# Patient Record
Sex: Female | Born: 1980 | Race: White | Hispanic: No | Marital: Single | Smoking: Current every day smoker
Health system: Southern US, Community
[De-identification: ages and names within clinical notes are randomized; demographics above are authoritative.]

## PROBLEM LIST (undated history)

## (undated) DIAGNOSIS — R569 Unspecified convulsions: Principal | ICD-10-CM

## (undated) DIAGNOSIS — F1910 Other psychoactive substance abuse, uncomplicated: Secondary | ICD-10-CM

## (undated) DIAGNOSIS — Z720 Tobacco use: Secondary | ICD-10-CM

## (undated) DIAGNOSIS — F32A Depression, unspecified: Secondary | ICD-10-CM

## (undated) DIAGNOSIS — Z789 Other specified health status: Secondary | ICD-10-CM

## (undated) DIAGNOSIS — F1090 Alcohol use, unspecified, uncomplicated: Secondary | ICD-10-CM

## (undated) DIAGNOSIS — C539 Malignant neoplasm of cervix uteri, unspecified: Secondary | ICD-10-CM

## (undated) HISTORY — PX: OTHER SURGICAL HISTORY: SHX169

---

## 2005-02-27 ENCOUNTER — Inpatient Hospital Stay: Payer: Self-pay | Admitting: Obstetrics and Gynecology

## 2006-06-02 ENCOUNTER — Encounter: Payer: Self-pay | Admitting: Obstetrics and Gynecology

## 2006-07-17 ENCOUNTER — Emergency Department: Payer: Self-pay | Admitting: Emergency Medicine

## 2006-07-27 ENCOUNTER — Emergency Department: Payer: Self-pay | Admitting: Emergency Medicine

## 2006-08-07 ENCOUNTER — Encounter: Payer: Self-pay | Admitting: Maternal & Fetal Medicine

## 2006-08-18 ENCOUNTER — Inpatient Hospital Stay: Payer: Self-pay | Admitting: Obstetrics & Gynecology

## 2010-03-19 ENCOUNTER — Encounter: Payer: Self-pay | Admitting: Maternal & Fetal Medicine

## 2010-05-08 ENCOUNTER — Encounter: Payer: Self-pay | Admitting: Pediatric Cardiology

## 2010-08-28 ENCOUNTER — Inpatient Hospital Stay: Payer: Self-pay

## 2011-06-04 ENCOUNTER — Ambulatory Visit: Payer: Self-pay | Admitting: Family Medicine

## 2011-08-19 ENCOUNTER — Encounter: Payer: Self-pay | Admitting: Maternal and Fetal Medicine

## 2011-12-23 ENCOUNTER — Inpatient Hospital Stay: Payer: Self-pay | Admitting: Obstetrics and Gynecology

## 2011-12-23 LAB — CBC WITH DIFFERENTIAL/PLATELET
Basophil #: 0.1 10*3/uL (ref 0.0–0.1)
Eosinophil #: 0.1 10*3/uL (ref 0.0–0.7)
HCT: 33 % — ABNORMAL LOW (ref 35.0–47.0)
Lymphocyte #: 1.5 10*3/uL (ref 1.0–3.6)
Lymphocyte %: 11.8 %
MCH: 29.2 pg (ref 26.0–34.0)
MCV: 87 fL (ref 80–100)
Monocyte %: 6.1 %
Platelet: 205 10*3/uL (ref 150–440)
RDW: 13.8 % (ref 11.5–14.5)

## 2011-12-24 LAB — HEMATOCRIT: HCT: 25.4 % — ABNORMAL LOW (ref 35.0–47.0)

## 2011-12-24 LAB — PATHOLOGY REPORT

## 2012-11-24 ENCOUNTER — Emergency Department: Payer: Self-pay | Admitting: Emergency Medicine

## 2012-11-24 LAB — CBC
HCT: 31.2 % — ABNORMAL LOW (ref 35.0–47.0)
HGB: 10.2 g/dL — ABNORMAL LOW (ref 12.0–16.0)
MCH: 25.5 pg — ABNORMAL LOW (ref 26.0–34.0)
MCV: 78 fL — ABNORMAL LOW (ref 80–100)
WBC: 21.9 10*3/uL — ABNORMAL HIGH (ref 3.6–11.0)

## 2012-11-24 LAB — COMPREHENSIVE METABOLIC PANEL
Anion Gap: 5 — ABNORMAL LOW (ref 7–16)
Bilirubin,Total: 0.6 mg/dL (ref 0.2–1.0)
Calcium, Total: 9.4 mg/dL (ref 8.5–10.1)
Creatinine: 0.98 mg/dL (ref 0.60–1.30)
EGFR (African American): >60
EGFR (Non-African Amer.): >60
Osmolality: 264 (ref 275–301)
Potassium: 4 mmol/L (ref 3.5–5.1)
SGOT(AST): 37 U/L (ref 15–37)
SGPT (ALT): 28 U/L (ref 12–78)
Sodium: 133 mmol/L — ABNORMAL LOW (ref 136–145)
Total Protein: 8.9 g/dL — ABNORMAL HIGH (ref 6.4–8.2)

## 2012-11-24 LAB — URINALYSIS, COMPLETE
Bacteria: NONE SEEN
Bilirubin,UR: NEGATIVE
Ketone: NEGATIVE
Leukocyte Esterase: NEGATIVE
Protein: 100
RBC,UR: <1 /HPF (ref 0–5)
Specific Gravity: 1.023 (ref 1.003–1.030)
WBC UR: 1 /HPF (ref 0–5)

## 2013-04-06 ENCOUNTER — Emergency Department: Payer: Self-pay | Admitting: Emergency Medicine

## 2014-02-27 ENCOUNTER — Emergency Department: Payer: Self-pay | Admitting: Physician Assistant

## 2014-02-27 LAB — COMPREHENSIVE METABOLIC PANEL
Albumin: 3.7 g/dL (ref 3.4–5.0)
Alkaline Phosphatase: 95 U/L (ref 46–116)
Anion Gap: 5 — ABNORMAL LOW (ref 7–16)
BUN: 12 mg/dL (ref 7–18)
Bilirubin,Total: 0.7 mg/dL (ref 0.2–1.0)
Calcium, Total: 9.6 mg/dL (ref 8.5–10.1)
Chloride: 102 mmol/L (ref 98–107)
Co2: 28 mmol/L (ref 21–32)
Creatinine: 0.89 mg/dL (ref 0.60–1.30)
Glucose: 112 mg/dL — ABNORMAL HIGH (ref 65–99)
Osmolality: 271 (ref 275–301)
Potassium: 4 mmol/L (ref 3.5–5.1)
SGOT(AST): 33 U/L (ref 15–37)
SGPT (ALT): 23 U/L (ref 14–63)
Sodium: 135 mmol/L — ABNORMAL LOW (ref 136–145)
Total Protein: 8.9 g/dL — ABNORMAL HIGH (ref 6.4–8.2)

## 2014-02-27 LAB — URINALYSIS, COMPLETE
Bilirubin,UR: NEGATIVE
GLUCOSE, UR: NEGATIVE mg/dL (ref 0–75)
Ketone: NEGATIVE
NITRITE: POSITIVE
Ph: 5 (ref 4.5–8.0)
Specific Gravity: 1.017 (ref 1.003–1.030)
Squamous Epithelial: 6

## 2014-02-27 LAB — CBC
HCT: 32.5 % — ABNORMAL LOW (ref 35.0–47.0)
HGB: 10.3 g/dL — ABNORMAL LOW (ref 12.0–16.0)
MCH: 23.3 pg — ABNORMAL LOW (ref 26.0–34.0)
MCHC: 31.5 g/dL — AB (ref 32.0–36.0)
MCV: 74 fL — ABNORMAL LOW (ref 80–100)
PLATELETS: 292 10*3/uL (ref 150–440)
RBC: 4.39 10*6/uL (ref 3.80–5.20)
RDW: 16.9 % — AB (ref 11.5–14.5)
WBC: 16.6 10*3/uL — ABNORMAL HIGH (ref 3.6–11.0)

## 2014-02-27 LAB — WET PREP, GENITAL

## 2014-05-17 NOTE — Op Note (Signed)
PATIENT NAME:  Felicia Nelson, Felicia Nelson MR#:  [id number] DATE OF BIRTH:  March 17, 1980  DATE OF PROCEDURE:  12/23/2011  PREOPERATIVE DIAGNOSES:  1. Term intrauterine pregnancy at 38 weeks, 4 days gestation.  2. Face presentation with mentum posterior.  3. Undesired fertility.   POSTOPERATIVE DIAGNOSES: 1. Term intrauterine pregnancy at 38 weeks, 4 days gestation.  2. Face presentation with mentum posterior.  3. Undesired fertility.   PROCEDURES PERFORMED:  1. Primary low transverse Cesarean section via Pfannenstiel skin incision. 2. Bilateral tubal ligation using Pomeroy method.  ANESTHESIA: Spinal.   PRIMARY SURGEON: Stoney Bang. Georgianne Fick, MD   ASSISTANT: Dalia Heading, CNM   ESTIMATED BLOOD LOSS: 400 mL.  OPERATIVE FLUIDS: 700 mL of Crystalloid.   URINE OUTPUT: 150 mL of clear urine.  PREOPERATIVE ANTIBIOTICS: 2 grams Ancef.  DRAINS OR TUBES:  1. Foley to gravity drainage. 2. On-Q catheter system.   IMPLANTS: None.   COMPLICATIONS: None.   SPECIMENS REMOVED: Portion of right and left tube as well as placenta.   INTRAOPERATIVE FINDINGS: Normal tubes, ovaries, and uterus. There was a left lateral hysterotomy extension which required placement of an O'Leary stitch. Delivery resulted in the birth of a liveborn female infant weighing 2680 grams, 5 pounds, 15 ounces; Apgars 9 and 9.   PATIENT CONDITION FOLLOWING PROCEDURE: Stable.   PROCEDURE IN DETAIL: Risks, benefits, and alternatives of the procedure as well as indication of a face presentation with mentum posterior were discussed with the patient prior to proceeding to the operating room. The patient was taken to the operating room where spinal anesthesia was administered. She was positioned in the supine position, prepped and draped in the usual sterile fashion. Time-out was performed and the level of anesthetic was noted to be adequate. A Pfannenstiel skin incision was made 2 cm above the pubic symphysis, carried down sharply to the  level of the rectus fascia using a knife. Fascia was incised in the midline using a knife and the fascial incision was extended using Mayo scissors. The rectus muscles were dissected off the superior border of the rectus fascia by elevating the rectus fascia with two Kocher clamps and then bluntly dissecting off the rectus muscles. The median raphe was incised using Mayo scissors. The inferior border of the rectus fascia was dissected off the rectus muscles in a similar fashion. The midline was identified. The peritoneum was entered bluntly. The peritoneal incision was extended using manual traction. A bladder blade was placed and a bladder flap was then created using Metzenbaum scissors and further developed digitally. The bladder blade was then replaced. Hysterotomy incision was made low transverse on the uterus. The uterus was entered bluntly using the operator's finger. Hysterotomy incision was extended using manual traction. Upon placing the operator's hand into the hysterotomy incision, the infant was noted to be in a face presentation with mentum posterior. The vertex was grasped, gently elevated, flexed, brought to the incision and delivered atraumatically using fundal pressure. The infant was suctioned, cord was clamped and cut, and the infant was passed to the awaiting pediatrician. Cord blood was obtained. The placenta was delivered using manual extraction. The uterus was exteriorized and wiped clean of clots and debris. Hysterotomy incision was closed using a single layer closure of 0 Vicryl in a running locked fashion. There was an extension noted on the left side of the hysterotomy incision that appeared to involve a branch of the uterine artery which in order to achieve hemostasis a single O'Leary stitch was placed on that side.  Following this good hemostasis was noted.   Attention was then turned to the right fallopian tube. It was grasped in the mid isthmic portion with a Babcock clamp, then  doubly suture ligated using a 0 chromic wheal. The intervening knuckle of tube was then excised using Metzenbaum scissors. This was repeated on the patient's left. The abdomen was then irrigated with warm saline.   The uterus was returned to the abdomen and the hysterotomy incision was reinspected and noted to be hemostatic as was the fallopian stumps. Peritoneum was then closed using 2-0 Vicryl in a running fashion. Following peritoneal closure, the On-Q catheter system was placed in the usual fashion. Following placement of the On-Q catheters, the fascia was closed using a #1 looped PDS in a running fashion. Subcutaneous tissue was irrigated. Hemostasis was achieved using the Bovie. The skin was closed using staples. Each On-Q catheter was then bolused with 5 mL of 1% bupivacaine. Following this, the On-Q catheters were dressed with Steri-Strips and a Tegaderm. Sponge, needle, and instrument counts were correct x2. The patient tolerated the procedure well and was taken to the recovery room in stable condition.   ____________________________ Stoney Bang. Georgianne Fick, MD ams:drc D: 12/24/2011 11:40:17 ET T: 12/24/2011 12:00:52 ET JOB#: [id number]  cc: Stoney Bang. Georgianne Fick, MD, <Dictator> Conan Bowens Madelon Lips MD ELECTRONICALLY SIGNED 01/02/2012 15:44

## 2014-06-07 NOTE — H&P (Signed)
L&D Evaluation:  History:   HPI 34 year old G10 P34 with EDC=01/02/2012 by a 9wk5day ultrasound presents to L&D at 38 4/7 weeks with c/o strong regular contractions between 5 and 6 Am this morning. She has had a moderate bloody show also. No LOF otherwise. Her prenatal care is via [facility] and was begun mid second trimester. Her last delivery was in 28 August 2010. 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 She is O negative blood type and received Rhogam 9/17. She has a history of depression and is currently taking Zoloft 100 mgm. She is also a smoker, now smoking 1 PPD. She had an ultrasound last week for growth but is not aware of results. OB HX significant for 9 vaginal deliveries (8 at term and one preterm at 36 weeks) with weights ranging from 5#7oz to 7#14 oz. LAbs: O negative, RI, VI, GBS positive. Patient desires a pp BTL and signed her 30 day papers 10/15/2011.    Presents with contractions, vaginal bleeding    Patient's Medical History Depresssion, severe cx  dysplasia, mild anemia    Patient's Surgical History Cleft lip repair, hand surgery (4 years ago)    Medications Pre Natal Vitamins  Zoloft 100 mgm daily    Allergies PCN, ? rxn as a child    Social History tobacco    Family History Non-Contributory   ROS:   ROS see HPI   Exam:   Vital Signs stable    Urine Protein not completed    General breathing with ctxs    Mental Status clear    Chest clear    Heart normal sinus rhythm, no murmur/gallop/rubs    Abdomen gravid, tender with contractions    Estimated Fetal Weight Small for gestational age    Fetal Position vtx    Edema no edema    Reflexes 2+    Pelvic no external lesions, 6-7/C/-2/BBOW    Mebranes Intact    FHT 130 baseline with accels to 150s, occasional  variable with quick rtn, mod variability.    Ucx q2-5 min    Skin dry    Lymph no lymphadenopathy     Impression:   Impression [place] at at 38 + weeks in active labor. +GBS   Plan:   Plan EFM/NST, monitor contractions and for cervical change, antibiotics for GBBS prophylaxis, Kefzol started for GBS coverage. IV line in place. T&S done.   Electronic Signatures: Karene Fry (CNM)  (Signed [phone] 08:41)  Authored: L&D Evaluation   Last Updated: 25-Nov-13 08:41 by Karene Fry (CNM)

## 2016-04-17 ENCOUNTER — Emergency Department
Admission: EM | Admit: 2016-04-17 | Discharge: 2016-04-18 | Disposition: A | Discharge status: Home / Self Care | Payer: Medicaid Other | Attending: Emergency Medicine | Admitting: Emergency Medicine

## 2016-04-17 ENCOUNTER — Encounter: Payer: Self-pay | Admitting: Emergency Medicine

## 2016-04-17 DIAGNOSIS — K047 Periapical abscess without sinus: Secondary | ICD-10-CM | POA: Diagnosis not present

## 2016-04-17 DIAGNOSIS — K0889 Other specified disorders of teeth and supporting structures: Secondary | ICD-10-CM

## 2016-04-17 DIAGNOSIS — F17200 Nicotine dependence, unspecified, uncomplicated: Secondary | ICD-10-CM | POA: Insufficient documentation

## 2016-04-17 MED ORDER — CLINDAMYCIN HCL 300 MG PO CAPS: 300.0000 mg | ORAL_CAPSULE | Freq: Three times a day (TID) | ORAL | 0 refills | Status: AC

## 2016-04-17 MED ORDER — KETOROLAC TROMETHAMINE 30 MG/ML IJ SOLN
30.0000 mg | Freq: Once | INTRAMUSCULAR | Status: AC
  Administered 2016-04-17: 30 mg via INTRAMUSCULAR
  Filled 2016-04-17: qty 1

## 2016-04-17 MED ORDER — KETOROLAC TROMETHAMINE 10 MG PO TABS: 10.0000 mg | ORAL_TABLET | Freq: Four times a day (QID) | ORAL | 0 refills | Status: AC | PRN

## 2016-04-17 MED ORDER — CLINDAMYCIN HCL 150 MG PO CAPS
300.0000 mg | ORAL_CAPSULE | Freq: Once | ORAL | Status: AC
  Administered 2016-04-17: 300 mg via ORAL
  Filled 2016-04-17: qty 2

## 2016-04-17 NOTE — Discharge Instructions (Signed)
OPTIONS FOR DENTAL FOLLOW UP CARE ° °[place] Department of Health and Human Services - Local Safety Net Dental Clinics °http://www.ncdhhs.gov/dph/oralhealth/services/safetynetclinics.htm °  °[facility] (336-562-3123) ° °[facility] [place] (919-933-9087) ° °[facility] ([phone] ext [phone]) ° °[facility] ([phone]) ° °[facility] ([phone]) °This clinic caters to the indigent population and is on a lottery system. °Location: °[facility], [address], [place] °Clinic Hours: °Wednesdays from 6pm - 9pm, patients seen by a lottery system. °For dates, call or go to [email or web address].[facility].edu/[facility]/patients/Dental-[facility] °Services: °Cleanings, fillings and simple extractions. °Payment Options: °DENTAL WORK IS FREE OF CHARGE. Bring proof of income or support. °Best way to get seen: °Arrive at 5:15 pm - this is a lottery, NOT first come/first serve, so arriving earlier will not increase your chances of being seen. °  °  °[facility] °[phone] °Select option 1 for emergencies °  °Location: °[facility], [address], [place] °Clinic Hours: °No walk-ins accepted - call the day before to schedule an appointment. °Check in times are 9:30 am and 1:30 pm. °Services: °Simple extractions, temporary fillings, pulpectomy/pulp debridement, uncomplicated abscess drainage. °Payment Options: °PAYMENT IS DUE AT THE TIME OF SERVICE.  Fee is usually $100-200, additional surgical procedures (e.g. abscess drainage) may be extra. °Cash, checks, Visa/MasterCard accepted.  Can file Medicaid if patient is covered for dental - patient should call case worker to check. °No discount for [facility] patients. °Best way to get seen: °MUST call the day before and get onto the schedule. Can usually be seen the next 1-2 days. No walk-ins accepted. °  °  °[facility] °[phone] °   °Location: °[facility], [address], [place] °Clinic Hours: °M, W, Th, F 8am or 1:30pm, Tues 9a or 1:30 - first come/first served. °Services: °Simple extractions, temporary fillings, uncomplicated abscess drainage.  You do not need to be an [place] resident. °Payment Options: °PAYMENT IS DUE AT THE TIME OF SERVICE. °Dental insurance, otherwise sliding scale - bring proof of income or support. °Depending on income and treatment needed, cost is usually $50-200. °Best way to get seen: °Arrive early as it is first come/first served. °  °  °[facility] °[phone] °  °Location: °[address] °Clinic Hours: °Mon-Thu 8a-5p °Services: °Most basic dental services including extractions and fillings. °Payment Options: °PAYMENT IS DUE AT THE TIME OF SERVICE. °Sliding scale, up to 50% off - bring proof if income or support. °Medicaid with dental option accepted. °Best way to get seen: °Call to schedule an appointment, can usually be seen within 2 weeks OR they will try to see walk-ins - show up at 8a or 2p (you may have to wait). °  °  °[facility] °[phone] °[place] RESIDENTS ONLY °  °Location: °[facility], [address], [place],  [address] °Clinic Hours: By appointment only. °Monday - Thursday 8am-5pm, Friday 8am-12pm °Services: Cleanings, fillings, extractions. °Payment Options: °PAYMENT IS DUE AT THE TIME OF SERVICE. °Cash, Visa or MasterCard. Sliding scale - $30 minimum per service. °Best way to get seen: °Come in to office, complete packet and make an appointment - need proof of income °or support monies for each household member and proof of [place] residence. °Usually takes about a month to get in. °  °  °[facility] °[phone] °  °Location: °[address]   [place] °Clinic Hours: Walk-in Urgent Care Dental Services are offered Monday-Friday  mornings only. °The numbers of emergencies accepted daily is limited to the number of °providers available. °Maximum 15 - Mondays, Wednesdays & Thursdays °Maximum 10 - Tuesdays & Fridays °Services: °You do not need to be a [place] resident to be seen for a dental emergency. °Emergencies are defined as pain, swelling, abnormal bleeding, or dental trauma. Walkins will receive x-rays if needed. °NOTE: Dental cleaning is not an emergency. °Payment Options: °PAYMENT IS DUE AT THE TIME OF SERVICE. °Minimum co-pay is $40.00 for uninsured patients. °Minimum co-pay is $3.00 for Medicaid with dental coverage. °Dental Insurance is accepted and must be presented at time of visit. °Medicare does not cover dental. °Forms of payment: Cash, credit card, checks. °Best way to get seen: °If not previously registered with the clinic, walk-in dental registration begins at 7:15 am and is on a first come/first serve basis. °If previously registered with the clinic, call to make an appointment. °  °  °[facility] °[phone] °[place] RESIDENTS ONLY °  °Location: °[address], [place], [place] °Clinic Hours: °Mon-Thu 10a-2p °Services: Extractions only! °Payment Options: °FREE (donations accepted) - bring proof of income or support °Best way to get seen: °Call and schedule an appointment OR come at 8am on the 1st Monday of every month (except for holidays) when it is first come/first served. °  °  °[facility] °[phone] °  °Location: °[address], [place] °Clinic Hours: °Friday mornings °Services, Payment Options, Best way to get seen: °Call for info °

## 2016-04-17 NOTE — ED Triage Notes (Signed)
Pt ambulatory to triage with steady gait, no distress noted. Pt c/o left bottom dental pain, discharge and swelling x2 days. Area of symptoms is black, no discharge noted, swelling noted to the left side of jaw. Pt sts she has dental appointment on May 02, 2016, unable to wait until appointment due to pain.

## 2016-04-18 NOTE — ED Provider Notes (Signed)
[facility] Emergency Department Provider Note  ____________________________________________  Time seen: Approximately 12:12 AM  I have reviewed the triage vital signs and the nursing notes.   HISTORY  Chief Complaint Dental Pain    HPI Felicia Nelson is a 36 y.o. female presenting to the emergency department with a left lower jaw dental abscess. Patient states that she has numerous dental caries along the left lower jaw. She is unable to isolate a single tooth as a source for the dental abscess. Patient denies fever and chills. Patient states that she has an appointment with a local dentist on 05/02/2016. However, she is concerned that infection will have to be managed prior to seeking care with dentist. Patient has been taking ibuprofen but no other alleviating measures. 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   History reviewed. No pertinent past medical history.  There are no active problems to display for this patient.   History reviewed. No pertinent surgical history.  Prior to Admission medications   Medication Sig Start Date End Date Taking? Authorizing Provider  clindamycin (CLEOCIN) 300 MG capsule Take 1 capsule (300 mg total) by mouth 3 (three) times daily. 04/17/16 04/27/16  Lannie Fields, PA-C  ketorolac (TORADOL) 10 MG tablet Take 1 tablet (10 mg total) by mouth every 6 (six) hours as needed. 04/17/16 04/22/16  Lannie Fields, PA-C    Allergies Patient has no known allergies.  History reviewed. No pertinent family history.  Social History Social History  Substance Use Topics  . Smoking status: Current Every Day Smoker  . Smokeless tobacco: Never Used  . Alcohol use No    Review of Systems  Constitutional: No fever/chills Eyes: No visual changes. No discharge ENT: She has dental abscess and dental pain. Cardiovascular: no chest pain. Respiratory: no cough. No SOB. Gastrointestinal: No abdominal pain.  No nausea,  no vomiting.  No diarrhea.  No constipation. Genitourinary: Negative for dysuria. No hematuria Musculoskeletal: Negative for musculoskeletal pain. Skin: Negative for rash, abrasions, lacerations, ecchymosis. Neurological: Negative for headaches, focal weakness or numbness. ____________________________________________   PHYSICAL EXAM:  VITAL SIGNS: ED Triage Vitals  Enc Vitals Group     BP 04/17/16 2057 (!) 142/82     Pulse Rate 04/17/16 2057 89     Resp 04/17/16 2057 16     Temp 04/17/16 2057 98.6 F (37 C)     Temp Source 04/17/16 2057 Oral     SpO2 04/17/16 2057 100 %     Weight 04/17/16 2058 120 lb (54.4 kg)     Height 04/17/16 2058 5\' 4"  (1.626 m)     Head Circumference --      Peak Flow --      Pain Score 04/17/16 2135 8     Pain Loc --      Pain Edu? --      Excl. in [place]? --    Constitutional: Alert and oriented. Well appearing and in no acute distress. Eyes: Conjunctivae are normal. PERRL. EOMI. Head: Atraumatic. ENT:      Ears: Tympanic membranes are pearly bilaterally.      Nose: No congestion/rhinnorhea.      Mouth/Throat:  Patient has a 1 cm x 1 cm region of focal edema of the skin overlying the left lower jaw. Patient has numerous dental caries along the left lower jaw. Neck: FROM.  Hematological/Lymphatic/Immunilogical: No cervical lymphadenopathy. Cardiovascular: Normal rate, regular rhythm. Normal S1 and S2.  Good peripheral circulation.  Respiratory: Normal respiratory effort without tachypnea or retractions. Lungs CTAB. Good air entry to the bases with no decreased or absent breath sounds. Musculoskeletal: Full range of motion to all extremities. No gross deformities appreciated. Neurologic:  Normal speech and language. No gross focal neurologic deficits are appreciated.  Skin:  Skin is warm, dry and intact. No rash noted. Psychiatric: Mood and affect are normal. Speech and behavior are normal. Patient exhibits appropriate insight and  judgement. ____________________________________________   LABS (all labs ordered are listed, but only abnormal results are displayed)  Labs Reviewed - No data to display ____________________________________________  EKG   ____________________________________________  RADIOLOGY   No results found.  ____________________________________________    PROCEDURES  Procedure(s) performed:    Procedures    Medications  ketorolac (TORADOL) 30 MG/ML injection 30 mg (30 mg Intramuscular Given 04/17/16 2333)  clindamycin (CLEOCIN) capsule 300 mg (300 mg Oral Given 04/17/16 2333)     ____________________________________________   INITIAL IMPRESSION / ASSESSMENT AND PLAN / ED COURSE  Pertinent labs & imaging results that were available during my care of the patient were reviewed by me and considered in my medical decision making (see chart for details).  Review of the [place] CSRS was performed in accordance of the [place] prior to dispensing any controlled drugs.     Assessment and Plan: Dental Abscess  Patient presents to the emergency department with a dental abscess of the skin overlying the left lower jaw. Patient has numerous dental caries of the left lower jaw. Patient was advised to seek care with a dentist immediately. An injection of Toradol was given in the emergency department and patient was discharged with oral Toradol. Patient was given clindamycin in the emergency department and discharged with clindamycin. Vital signs are reassuring at this time. All patient questions were answered.  ____________________________________________  FINAL CLINICAL IMPRESSION(S) / ED DIAGNOSES  Final diagnoses:  Pain, dental  Dental abscess      NEW MEDICATIONS STARTED DURING THIS VISIT:  Discharge Medication List as of 04/17/2016 11:26 PM    START taking these medications   Details  clindamycin (CLEOCIN) 300 MG capsule Take 1 capsule (300 mg total) by mouth 3 (three) times  daily., Starting Wed 04/17/2016, Until Sat 04/27/2016, Print    ketorolac (TORADOL) 10 MG tablet Take 1 tablet (10 mg total) by mouth every 6 (six) hours as needed., Starting Wed 04/17/2016, Until Mon 04/22/2016, Print            This chart was dictated using voice recognition software/Dragon. Despite best efforts to proofread, errors can occur which can change the meaning. Any change was purely unintentional.    Lannie Fields, PA-C 04/18/16 0019    Eula Listen, MD 04/20/16 1048

## 2016-07-15 ENCOUNTER — Emergency Department
Admission: EM | Admit: 2016-07-15 | Discharge: 2016-07-15 | Disposition: A | Discharge status: Home / Self Care | Payer: Medicaid Other | Attending: Emergency Medicine | Admitting: Emergency Medicine

## 2016-07-15 ENCOUNTER — Emergency Department: Payer: Medicaid Other

## 2016-07-15 DIAGNOSIS — W172XXA Fall into hole, initial encounter: Secondary | ICD-10-CM | POA: Diagnosis not present

## 2016-07-15 DIAGNOSIS — Y9301 Activity, walking, marching and hiking: Secondary | ICD-10-CM | POA: Insufficient documentation

## 2016-07-15 DIAGNOSIS — F17200 Nicotine dependence, unspecified, uncomplicated: Secondary | ICD-10-CM | POA: Diagnosis not present

## 2016-07-15 DIAGNOSIS — Y9269 Other specified industrial and construction area as the place of occurrence of the external cause: Secondary | ICD-10-CM | POA: Diagnosis not present

## 2016-07-15 DIAGNOSIS — S0101XA Laceration without foreign body of scalp, initial encounter: Secondary | ICD-10-CM | POA: Diagnosis not present

## 2016-07-15 DIAGNOSIS — S0990XA Unspecified injury of head, initial encounter: Secondary | ICD-10-CM

## 2016-07-15 DIAGNOSIS — W19XXXA Unspecified fall, initial encounter: Secondary | ICD-10-CM

## 2016-07-15 DIAGNOSIS — Y998 Other external cause status: Secondary | ICD-10-CM | POA: Insufficient documentation

## 2016-07-15 MED ORDER — BACITRACIN-NEOMYCIN-POLYMYXIN 400-5-5000 EX OINT
TOPICAL_OINTMENT | Freq: Once | CUTANEOUS | Status: AC
  Administered 2016-07-15: 1 via TOPICAL
  Filled 2016-07-15: qty 1

## 2016-07-15 MED ORDER — TRAMADOL HCL 50 MG PO TABS
50.0000 mg | ORAL_TABLET | Freq: Once | ORAL | Status: AC
  Administered 2016-07-15: 50 mg via ORAL
  Filled 2016-07-15: qty 1

## 2016-07-15 MED ORDER — LIDOCAINE-EPINEPHRINE-TETRACAINE (LET) SOLUTION
3.0000 mL | Freq: Once | NASAL | Status: AC
  Administered 2016-07-15: 3 mL via TOPICAL

## 2016-07-15 MED ORDER — TRAMADOL HCL 50 MG PO TABS: 50.0000 mg | ORAL_TABLET | Freq: Four times a day (QID) | ORAL | 0 refills | Status: DC | PRN

## 2016-07-15 MED ORDER — LIDOCAINE-EPINEPHRINE-TETRACAINE (LET) SOLUTION
NASAL | Status: AC
  Administered 2016-07-15: 3 mL via TOPICAL
  Filled 2016-07-15: qty 3
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
## 2016-07-15 NOTE — Discharge Instructions (Signed)
Please have your staples removed in 7-10 days

## 2016-07-15 NOTE — ED Notes (Signed)
Pt returned from CT °
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
## 2016-07-15 NOTE — ED Notes (Signed)

## 2016-07-15 NOTE — ED Notes (Signed)
Antibacterial ointment applied to laceration, then bandaged and wrapped using 2x2s and wrapped with gauze wrap. Pt educated about wound care and cleaning. Pt verbalized understanding.

## 2016-07-15 NOTE — ED Provider Notes (Signed)
[facility] Emergency Department Provider Note   ____________________________________________   First MD Initiated Contact with Patient 07/15/16 [phone]     (approximate)  I have reviewed the triage vital signs and the nursing notes.   HISTORY  Chief Complaint Fall    HPI Felicia Nelson is a 36 y.o. female who comes into the hospital today with a fall and a head injury. The patient reports that she was walking in an enclosure and there was a hole in the floor. She reports it is a pit where people typically fix cars. She reports that she was walking around a tractor and she must a fallen into the pit. She reports it is made of cement. She said that the last thing she remembers is falling and then having someone help her onto her porch. The patient reports that she was unconscious but she is unsure exactly how long she was. She states that she does not think it was a very long time. The patient has some head pain and neck pain which just started. She denies pain in her back and arms or legs. The patient rates her pain a 7 out of 10 in intensity. She came into the hospital by ambulance.    History reviewed. No pertinent past medical history.  There are no active problems to display for this patient.   History reviewed. No pertinent surgical history.  Prior to Admission medications   Medication Sig Start Date End Date Taking? Authorizing Provider  traMADol (ULTRAM) 50 MG tablet Take 1 tablet (50 mg total) by mouth every 6 (six) hours as needed. 07/15/16   Loney Hering, MD    Allergies Patient has no known allergies.  No family history on file.  Social History Social History  Substance Use Topics  . Smoking status: Current Every Day Smoker  . Smokeless tobacco: Never Used  . Alcohol use No    Review of Systems  Constitutional: No fever/chills Eyes: No visual changes. ENT: No sore throat. Cardiovascular: Denies chest pain. Respiratory:  Denies shortness of breath. Gastrointestinal: No abdominal pain.  No nausea, no vomiting.  No diarrhea.  No constipation. Genitourinary: Negative for dysuria. Musculoskeletal: Negative for back pain. Skin: Scalp laceration Neurological: Headache   ____________________________________________   PHYSICAL EXAM:  VITAL SIGNS: ED Triage Vitals  Enc Vitals Group     BP 07/15/16 0013 139/75     Pulse Rate 07/15/16 0013 72     Resp 07/15/16 0013 18     Temp 07/15/16 0013 98.6 F (37 C)     Temp Source 07/15/16 0013 Oral     SpO2 07/15/16 0013 100 %     Weight 07/15/16 0014 130 lb (59 kg)     Height 07/15/16 0014 5\' 4"  (1.626 m)     Head Circumference --      Peak Flow --      Pain Score 07/15/16 0019 6     Pain Loc --      Pain Edu? --      Excl. in [place]? --     Constitutional: Alert and oriented. Well appearing and in Mild distress. Eyes: Conjunctivae are normal. PERRL. EOMI. Head: Atraumatic. Nose: No congestion/rhinnorhea. Mouth/Throat: Mucous membranes are moist.  Oropharynx non-erythematous. Neck: Cervical spine tenderness to palpation Cardiovascular: Normal rate, regular rhythm. Grossly normal heart sounds.  Good peripheral circulation. Respiratory: Normal respiratory effort.  No retractions. Lungs CTAB. Gastrointestinal: Soft and nontender. No distention.Positive bowel sounds Musculoskeletal: No lower extremity  tenderness nor edema.  Neurologic:  Normal speech and language. Skin:  Skin is warm, dry and intact.  Psychiatric: Mood and affect are normal.   ____________________________________________   LABS (all labs ordered are listed, but only abnormal results are displayed)  Labs Reviewed - No data to display ____________________________________________  EKG  none ____________________________________________  RADIOLOGY  Ct Head Wo Contrast  Result Date: 07/15/2016 CLINICAL DATA:  Fall with occipital hematoma. Possible loss of consciousness. EXAM: CT HEAD  WITHOUT CONTRAST TECHNIQUE: Contiguous axial images were obtained from the base of the skull through the vertex without intravenous contrast. COMPARISON:  None. FINDINGS: Brain: No evidence of acute infarction, hemorrhage, hydrocephalus, extra-axial collection or mass lesion/mass effect. Mild generalized volume loss for age. Vascular: No hyperdense vessel or unexpected calcification. Skull: Normal. Negative for fracture or focal lesion. Sinuses/Orbits: Paranasal sinuses and mastoid air cells are clear. The visualized orbits are unremarkable. Other: Right occipital scalp laceration and hematoma. No radiopaque foreign body. IMPRESSION: Right occipital scalp laceration and hematoma without fracture or acute intracranial abnormality. Electronically Signed   By: Jeb Levering M.D.   On: 07/15/2016 01:45   Ct Cervical Spine Wo Contrast  Result Date: 07/15/2016 CLINICAL DATA:  Fall EXAM: CT CERVICAL SPINE WITHOUT CONTRAST TECHNIQUE: Multidetector CT imaging of the cervical spine was performed without intravenous contrast. Multiplanar CT image reconstructions were also generated. COMPARISON:  None. FINDINGS: Alignment: No static subluxation. Facets are aligned. Occipital condyles and the lateral masses of C1 and C2 are normally approximated. Skull base and vertebrae: No acute fracture. Soft tissues and spinal canal: No prevertebral fluid or swelling. No visible canal hematoma. Disc levels: No advanced spinal canal or neural foraminal stenosis. Upper chest: No pneumothorax, pulmonary nodule or pleural effusion. Other: Normal visualized paraspinal cervical soft tissues. IMPRESSION: No acute fracture or static subluxation of the cervical spine. Electronically Signed   By: Ulyses Jarred M.D.   On: 07/15/2016 03:28    ____________________________________________   PROCEDURES  Procedure(s) performed: please, see procedure note(s).  Marland Kitchen.Laceration Repair Date/Time: 07/15/2016 4:05 AM Performed by: Loney Hering Authorized by: Loney Hering   Consent:    Consent obtained:  Verbal   Consent given by:  Patient Anesthesia (see MAR for exact dosages):    Anesthesia method:  Topical application   Topical anesthetic:  LET Laceration details:    Location:  Scalp   Scalp location:  Occipital   Length (cm):  3 Repair type:    Repair type:  Simple Exploration:    Contaminated: no   Treatment:    Area cleansed with:  Soap and water   Amount of cleaning:  Standard Skin repair:    Repair method:  Staples   Number of staples:  3 Post-procedure details:    Dressing:  Antibiotic ointment   Patient tolerance of procedure:  Tolerated well, no immediate complications    Critical Care performed: No  ____________________________________________   INITIAL IMPRESSION / ASSESSMENT AND PLAN / ED COURSE  Pertinent labs & imaging results that were available during my care of the patient were reviewed by me and considered in my medical decision making (see chart for details).  This is a 36 year old who comes into the hospital today with a head injury and laceration. The patient had a CT scan of her head that showed an occipital scalp laceration with hematoma and no fracture. Jeannine Kitten the patient for a CT of her cervical spine as well and that I will repair her wound.  Clinical Course  as of Jul 15 428  Mon Jul 15, 2016  0207 Right occipital scalp laceration and hematoma without fracture or acute intracranial abnormality.   CT Head Wo Contrast [AW]  5830 No acute fracture or static subluxation of the cervical spine. CT Cervical Spine Wo Contrast [AW]    Clinical Course User Index [AW] Loney Hering, MD   The patient's CT scan is unremarkable. I will discharge the patient to home and she should have her staples removed in approximately 10 days.  ____________________________________________   FINAL CLINICAL IMPRESSION(S) / ED DIAGNOSES  Final diagnoses:  Fall, initial encounter  Injury  of head, initial encounter  Laceration of scalp, initial encounter      NEW MEDICATIONS STARTED DURING THIS VISIT:  New Prescriptions   TRAMADOL (ULTRAM) 50 MG TABLET    Take 1 tablet (50 mg total) by mouth every 6 (six) hours as needed.     Note:  This document was prepared using Dragon voice recognition software and may include unintentional dictation errors.    Loney Hering, MD 07/15/16 [phone]

## 2018-02-28 IMAGING — CT CT CERVICAL SPINE W/O CM
3 of 4 series · 12 of 33 positions shown, 14 images · non-contrast
Comparison: None.

CLINICAL DATA: Fall

EXAM:
CT CERVICAL SPINE WITHOUT CONTRAST
TECHNIQUE: Multidetector CT imaging of the cervical spine was performed without
intravenous contrast. Multiplanar CT image reconstructions were also
generated.

[Series 4: sagittal bone · sagittal · 0.22mm/px · 5 of 56 slices shown, 6 images]
[im 19/56  bone]
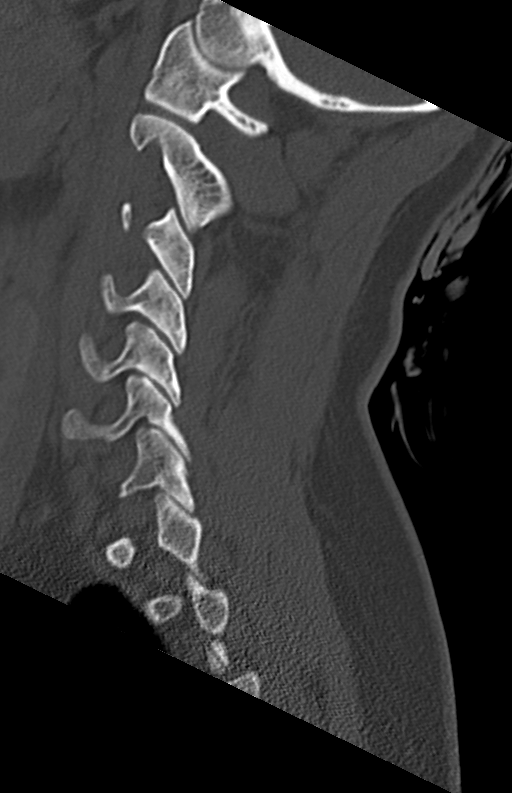
[im 23/56  bone]
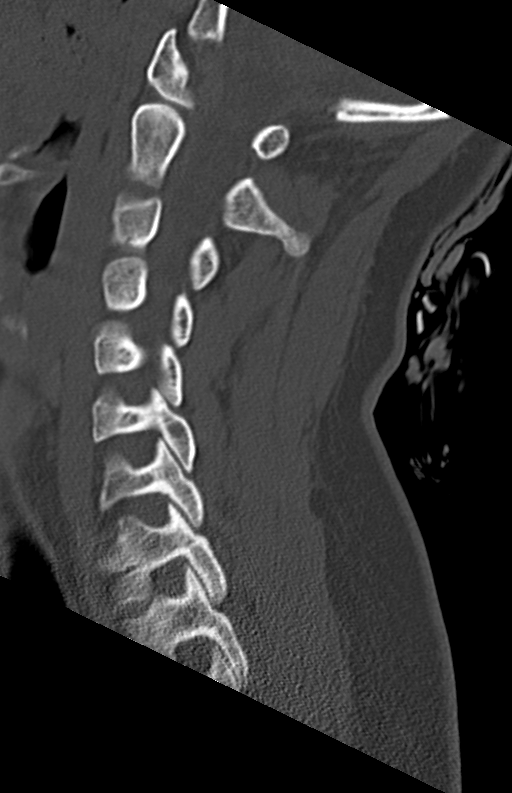
[im 28/56  soft-tissue]
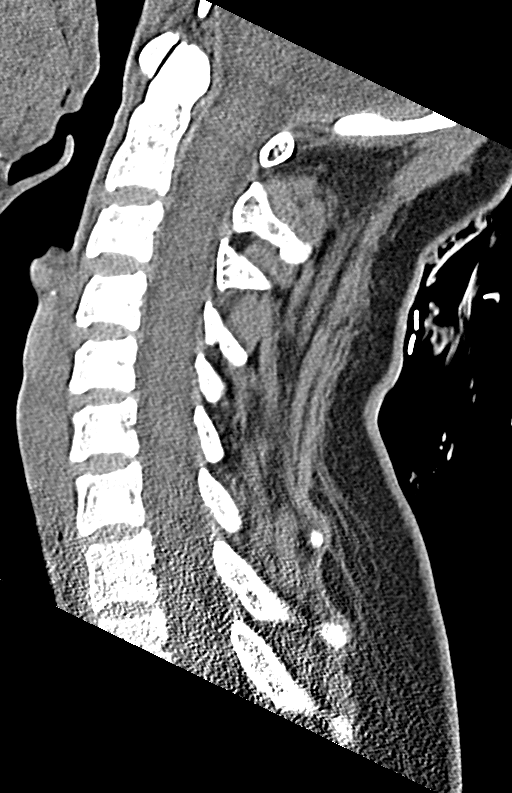
[im 28/56  bone]
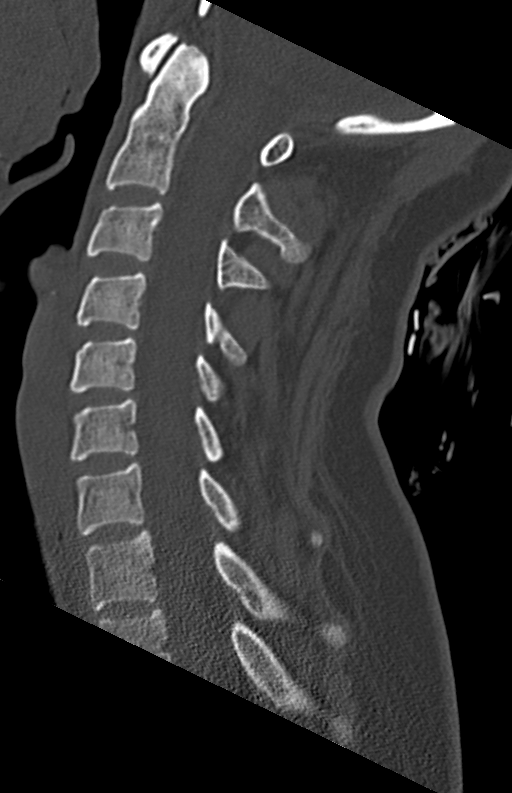
[im 33/56  bone]
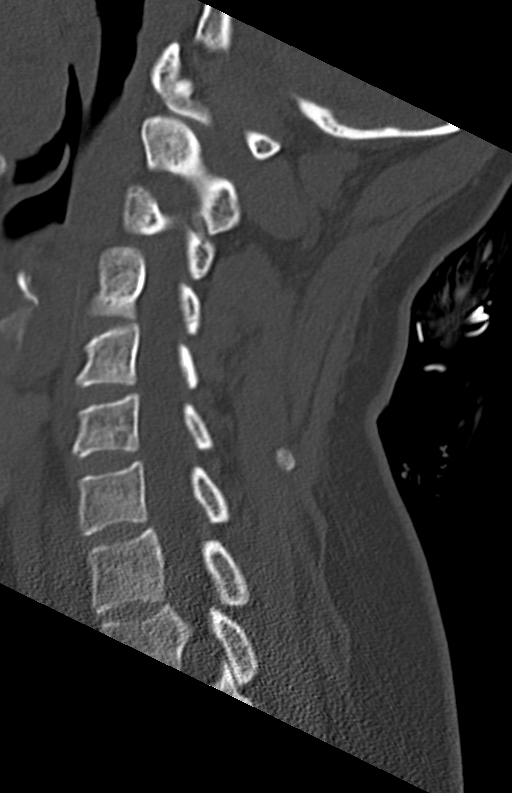
[im 37/56  bone]
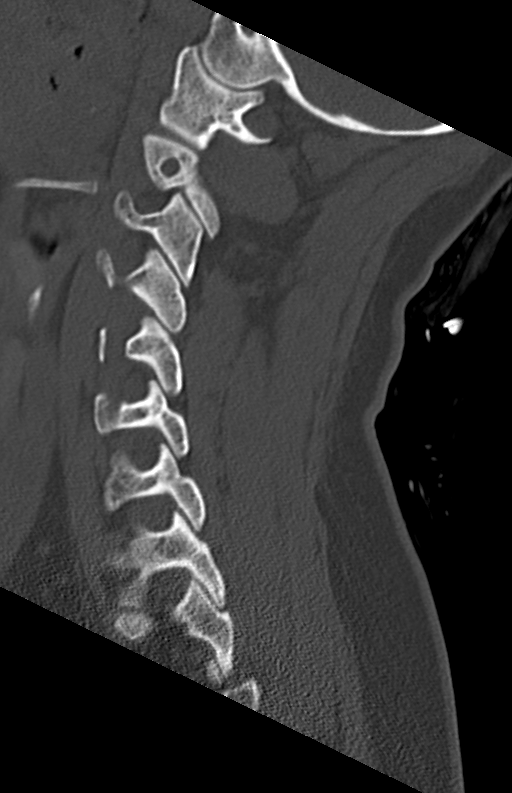

[Series 5: coronal bone · coronal · 0.22mm/px · 3 of 57 slices shown]
[im 12/57  bone]
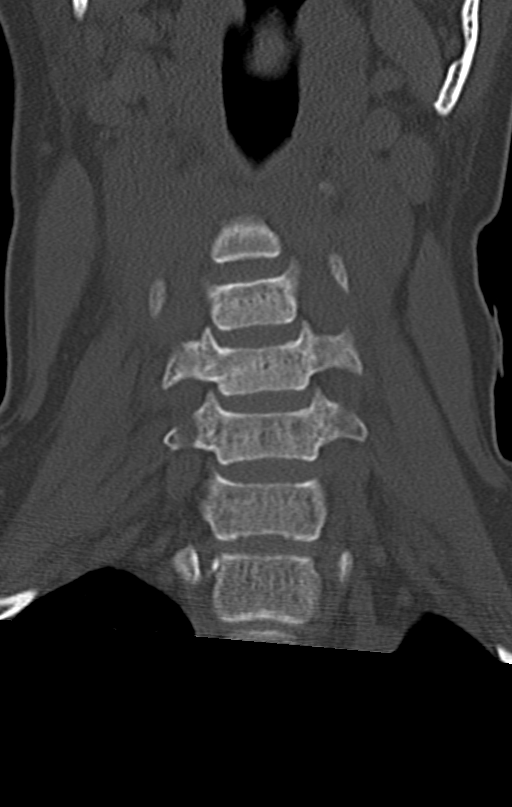
[im 23/57  bone]
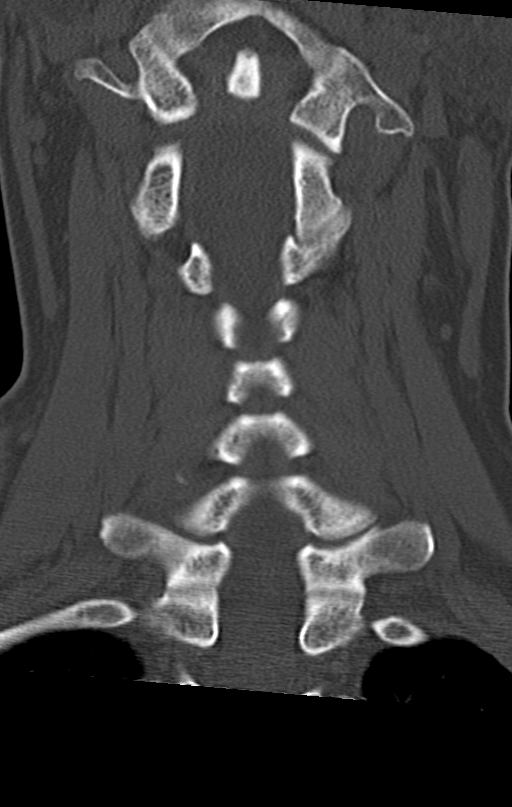
[im 34/57  bone]
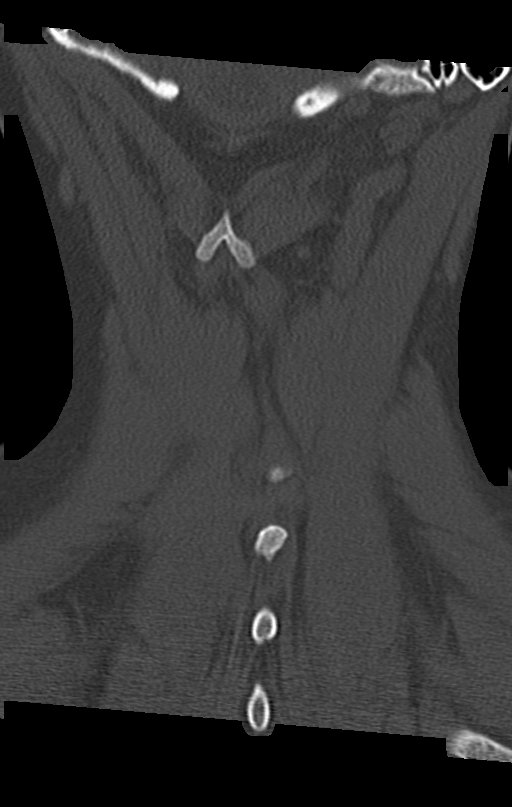

[Series 6: orthogonal bone · axial · 0.22mm/px · z∈[+126,+237]mm · 4 of 88 slices shown, 5 images]
[im 13/88  soft-tissue]
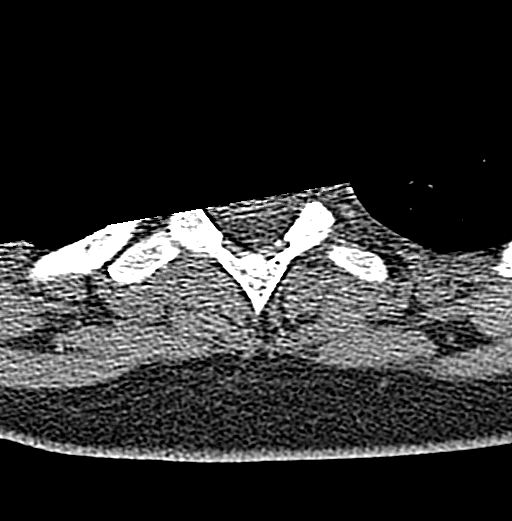
[im 13/88  bone]
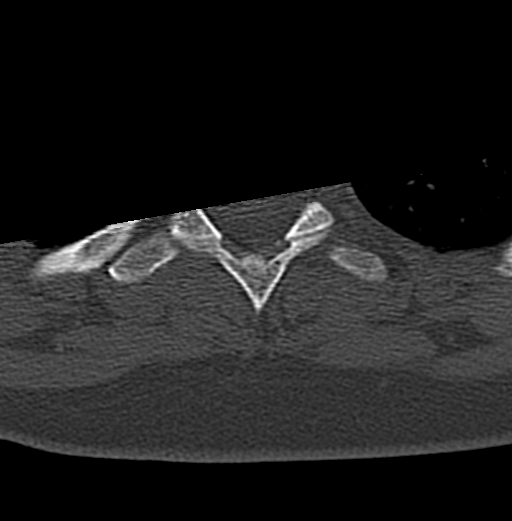
[im 38/88  bone]
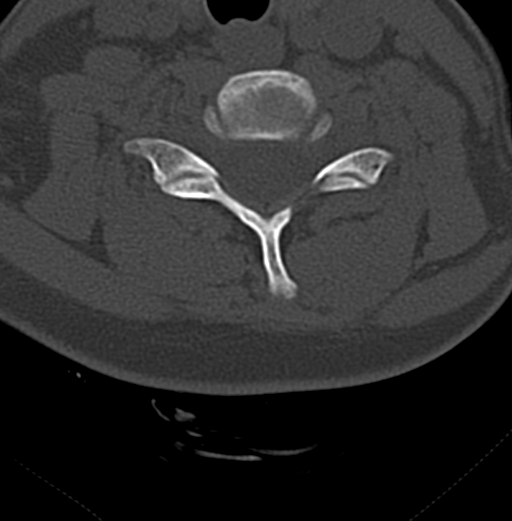
[im 50/88  bone]
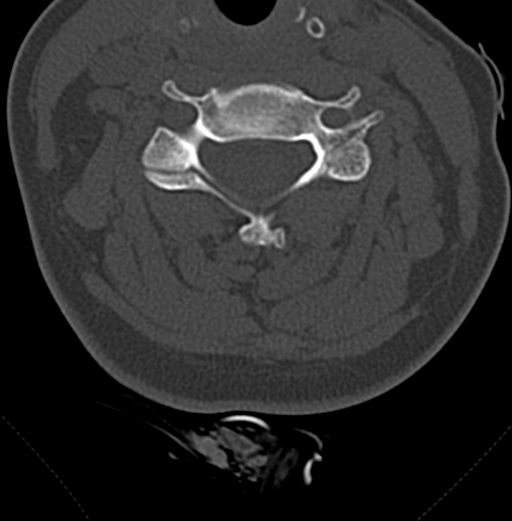
[im 75/88  bone]
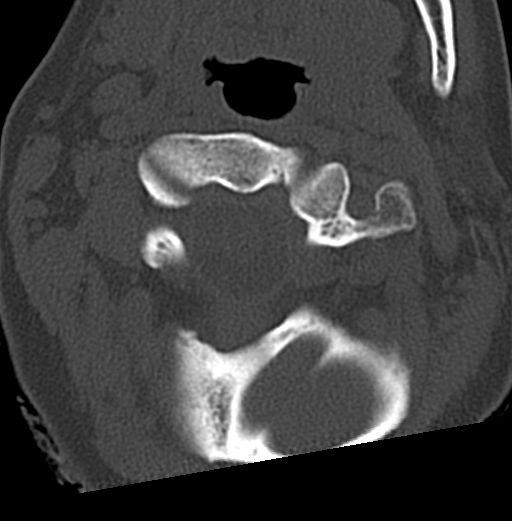

[12 of 33 positions shown; findings below may reference images not displayed]

FINDINGS: Alignment: No static subluxation. Facets are aligned. Occipital
condyles and the lateral masses of C1 and C2 are normally
approximated.

Skull base and vertebrae: No acute fracture.

Soft tissues and spinal canal: No prevertebral fluid or swelling. No
visible canal hematoma.

Disc levels: No advanced spinal canal or neural foraminal stenosis.

Upper chest: No pneumothorax, pulmonary nodule or pleural effusion.

Other: Normal visualized paraspinal cervical soft tissues.
IMPRESSION: No acute fracture or static subluxation of the cervical spine.

## 2020-11-20 ENCOUNTER — Ambulatory Visit: Payer: Medicaid Other | Admitting: Family Medicine

## 2020-11-20 ENCOUNTER — Encounter: Payer: Self-pay | Admitting: Family Medicine

## 2020-11-20 ENCOUNTER — Ambulatory Visit: Payer: Medicaid Other

## 2020-11-20 ENCOUNTER — Other Ambulatory Visit: Payer: Self-pay

## 2020-11-20 DIAGNOSIS — Z113 Encounter for screening for infections with a predominantly sexual mode of transmission: Secondary | ICD-10-CM | POA: Diagnosis not present

## 2020-11-20 DIAGNOSIS — Z8541 Personal history of malignant neoplasm of cervix uteri: Secondary | ICD-10-CM

## 2020-11-20 LAB — HM HIV SCREENING LAB: HM HIV Screening: NEGATIVE

## 2020-11-20 LAB — HM HEPATITIS C SCREENING LAB: HM Hepatitis Screen: NEGATIVE

## 2020-11-20 LAB — WET PREP FOR TRICH, YEAST, CLUE
Trichomonas Exam: NEGATIVE
Yeast Exam: NEGATIVE

## 2020-11-20 NOTE — Progress Notes (Signed)
Pt here for STD screening.  Wet mount results reviewed.  Condoms given. Windle Guard, RN

## 2020-11-20 NOTE — Progress Notes (Signed)
[facility] Department STI clinic/screening visit  Subjective:  ZELLA DEWAN is a 40 y.o. female being seen today for an STI screening visit. The patient reports they do not have symptoms.  Patient reports that they do not desire a pregnancy in the next year.   They reported they are not interested in discussing contraception today.  Patient's last menstrual period was 11/19/2020.   Patient has the following medical conditions:  There are no problems to display for this patient.   Chief Complaint  Patient presents with   SEXUALLY TRANSMITTED DISEASE    Screening     HPI  Patient reports here for screening before starting a new relationship, denies s/sx   Last HIV test per patient was with last pregnancy ~8 years ago  Patient reports last pap was ~8 years ago.  History of cervical cancer.    See flowsheet for further details and programmatic requirements.    The following portions of the patient's history were reviewed and updated as appropriate: allergies, current medications, past medical history, past social history, past surgical history and problem list.  Objective:  There were no vitals filed for this visit.  Physical Exam Vitals and nursing note reviewed.  Constitutional:      Appearance: Normal appearance.  HENT:     Head: Normocephalic and atraumatic.     Nose:      Mouth/Throat:     Mouth: Mucous membranes are moist.     Dentition: Abnormal dentition.     Pharynx: Oropharynx is clear. No oropharyngeal exudate or posterior oropharyngeal erythema.      Comments: Teeth missing Pulmonary:     Effort: Pulmonary effort is normal.  Abdominal:     General: Abdomen is flat.     Palpations: There is no mass.     Tenderness: There is no abdominal tenderness. There is no rebound.  Genitourinary:    General: Normal vulva.     Exam position: Lithotomy position.     Pubic Area: No rash or pubic lice.      Labia:        Right: No rash or lesion.         Left: No rash or lesion.      Vagina: Normal. No vaginal discharge, erythema, bleeding or lesions.     Cervix: No cervical motion tenderness, discharge, friability, lesion or erythema.     Uterus: Normal.      Adnexa: Right adnexa normal and left adnexa normal.     Rectum: Normal.  Lymphadenopathy:     Head:     Right side of head: No preauricular or posterior auricular adenopathy.     Left side of head: No preauricular or posterior auricular adenopathy.     Cervical: No cervical adenopathy.     Upper Body:     Right upper body: No supraclavicular or axillary adenopathy.     Left upper body: No supraclavicular or axillary adenopathy.     Lower Body: No right inguinal adenopathy. No left inguinal adenopathy.  Skin:    General: Skin is warm and dry.     Findings: No rash.  Neurological:     Mental Status: She is alert and oriented to person, place, and time.     Assessment and Plan:  AVEREIGH SPAINHOWER is a 40 y.o. female presenting to the [facility] Department for STI screening  1. Screening examination for venereal disease   Patient accepted all screenings including wet prep, vaginal CT/GC  and bloodwork for HCV/HIV/RPR.  Patient meets criteria for HepB screening? No. Ordered? No - does not meet criteria  Patient meets criteria for HepC screening? Yes. Ordered? Yes  Wet prep results neg    Treatment needed per standing order.  Discussed time line for [facility] Lab results and that patient will be called with positive results and encouraged patient to call if she had not heard in 2 weeks.  Counseled to return or seek care for continued or worsening symptoms Recommended condom use with all sex  Patient is currently using Postpartum Sterilization to prevent pregnancy.  - Chlamydia/Gonorrhea [place] [facility] - HIV/HCV [facility] - Syphilis Serology, [place] [facility] - WET PREP FOR [place], YEAST, CLUE   2. History of cervical cancer Pt reports that last pap was ` 8 years ago and  was positive for  cervical CA. Emphasized the importance of needing to see PCP or [facility] for re-screening.   -PCP list given.     Return for as needed.  No future appointments.  Junious Dresser, FNP

## 2020-11-21 NOTE — Progress Notes (Signed)
Reviewed with patient wet mount results and that she should RTC for test results visit for copy of all results.

## 2020-12-12 ENCOUNTER — Telehealth: Payer: Self-pay | Admitting: Family Medicine

## 2020-12-12 NOTE — Telephone Encounter (Signed)
Patient needs to talk with someone about her STI results. Please call.

## 2021-01-02 NOTE — Telephone Encounter (Signed)
Return call to patient to inquire if someone had reached out to her.  Message still in the queue.  Female answered the phone and reported she was not at home.  No message left. Dahlia Bailiff, RN

## 2022-02-24 ENCOUNTER — Inpatient Hospital Stay
Admission: EM | Admit: 2022-02-24 | Discharge: 2022-02-27 | DRG: 083 | Disposition: A | Discharge status: Home / Self Care | Payer: Medicaid Other | Attending: Internal Medicine | Admitting: Internal Medicine

## 2022-02-24 ENCOUNTER — Emergency Department: Payer: Medicaid Other

## 2022-02-24 ENCOUNTER — Other Ambulatory Visit: Payer: Self-pay

## 2022-02-24 ENCOUNTER — Inpatient Hospital Stay: Payer: Medicaid Other

## 2022-02-24 DIAGNOSIS — I629 Nontraumatic intracranial hemorrhage, unspecified: Secondary | ICD-10-CM

## 2022-02-24 DIAGNOSIS — Z6820 Body mass index (BMI) 20.0-20.9, adult: Secondary | ICD-10-CM | POA: Diagnosis not present

## 2022-02-24 DIAGNOSIS — F17210 Nicotine dependence, cigarettes, uncomplicated: Secondary | ICD-10-CM | POA: Diagnosis present

## 2022-02-24 DIAGNOSIS — S065X9A Traumatic subdural hemorrhage with loss of consciousness of unspecified duration, initial encounter: Secondary | ICD-10-CM | POA: Diagnosis present

## 2022-02-24 DIAGNOSIS — N390 Urinary tract infection, site not specified: Secondary | ICD-10-CM | POA: Diagnosis not present

## 2022-02-24 DIAGNOSIS — R7989 Other specified abnormal findings of blood chemistry: Secondary | ICD-10-CM | POA: Diagnosis present

## 2022-02-24 DIAGNOSIS — E872 Acidosis, unspecified: Secondary | ICD-10-CM | POA: Diagnosis present

## 2022-02-24 DIAGNOSIS — Z7151 Drug abuse counseling and surveillance of drug abuser: Secondary | ICD-10-CM

## 2022-02-24 DIAGNOSIS — F17290 Nicotine dependence, other tobacco product, uncomplicated: Secondary | ICD-10-CM | POA: Diagnosis present

## 2022-02-24 DIAGNOSIS — F10129 Alcohol abuse with intoxication, unspecified: Secondary | ICD-10-CM | POA: Diagnosis present

## 2022-02-24 DIAGNOSIS — F1910 Other psychoactive substance abuse, uncomplicated: Secondary | ICD-10-CM | POA: Diagnosis present

## 2022-02-24 DIAGNOSIS — S06359A Traumatic hemorrhage of left cerebrum with loss of consciousness of unspecified duration, initial encounter: Secondary | ICD-10-CM | POA: Diagnosis present

## 2022-02-24 DIAGNOSIS — Z7141 Alcohol abuse counseling and surveillance of alcoholic: Secondary | ICD-10-CM

## 2022-02-24 DIAGNOSIS — R569 Unspecified convulsions: Principal | ICD-10-CM

## 2022-02-24 DIAGNOSIS — Z79899 Other long term (current) drug therapy: Secondary | ICD-10-CM | POA: Diagnosis not present

## 2022-02-24 DIAGNOSIS — F1090 Alcohol use, unspecified, uncomplicated: Secondary | ICD-10-CM | POA: Diagnosis present

## 2022-02-24 DIAGNOSIS — Y92009 Unspecified place in unspecified non-institutional (private) residence as the place of occurrence of the external cause: Secondary | ICD-10-CM | POA: Diagnosis not present

## 2022-02-24 DIAGNOSIS — W19XXXA Unspecified fall, initial encounter: Secondary | ICD-10-CM

## 2022-02-24 DIAGNOSIS — F32A Depression, unspecified: Secondary | ICD-10-CM | POA: Diagnosis present

## 2022-02-24 DIAGNOSIS — S065XAA Traumatic subdural hemorrhage with loss of consciousness status unknown, initial encounter: Secondary | ICD-10-CM | POA: Diagnosis not present

## 2022-02-24 DIAGNOSIS — F4310 Post-traumatic stress disorder, unspecified: Secondary | ICD-10-CM | POA: Diagnosis present

## 2022-02-24 DIAGNOSIS — Z880 Allergy status to penicillin: Secondary | ICD-10-CM

## 2022-02-24 DIAGNOSIS — Z789 Other specified health status: Secondary | ICD-10-CM | POA: Diagnosis not present

## 2022-02-24 DIAGNOSIS — C539 Malignant neoplasm of cervix uteri, unspecified: Secondary | ICD-10-CM | POA: Insufficient documentation

## 2022-02-24 DIAGNOSIS — I609 Nontraumatic subarachnoid hemorrhage, unspecified: Secondary | ICD-10-CM | POA: Diagnosis not present

## 2022-02-24 DIAGNOSIS — G40909 Epilepsy, unspecified, not intractable, without status epilepticus: Secondary | ICD-10-CM | POA: Diagnosis present

## 2022-02-24 DIAGNOSIS — F1410 Cocaine abuse, uncomplicated: Secondary | ICD-10-CM | POA: Diagnosis present

## 2022-02-24 DIAGNOSIS — R64 Cachexia: Secondary | ICD-10-CM | POA: Diagnosis present

## 2022-02-24 DIAGNOSIS — Z833 Family history of diabetes mellitus: Secondary | ICD-10-CM | POA: Diagnosis not present

## 2022-02-24 DIAGNOSIS — Z720 Tobacco use: Secondary | ICD-10-CM | POA: Diagnosis not present

## 2022-02-24 DIAGNOSIS — D72829 Elevated white blood cell count, unspecified: Secondary | ICD-10-CM

## 2022-02-24 DIAGNOSIS — Z8541 Personal history of malignant neoplasm of cervix uteri: Secondary | ICD-10-CM | POA: Diagnosis not present

## 2022-02-24 HISTORY — DX: Other psychoactive substance abuse, uncomplicated: F19.10

## 2022-02-24 HISTORY — DX: Depression, unspecified: F32.A

## 2022-02-24 HISTORY — DX: Malignant neoplasm of cervix uteri, unspecified: C53.9

## 2022-02-24 HISTORY — DX: Unspecified convulsions: R56.9

## 2022-02-24 HISTORY — DX: Other specified abnormal findings of blood chemistry: R79.89

## 2022-02-24 HISTORY — DX: Tobacco use: Z72.0

## 2022-02-24 HISTORY — DX: Alcohol use, unspecified, uncomplicated: F10.90

## 2022-02-24 HISTORY — DX: Other specified health status: Z78.9

## 2022-02-24 HISTORY — DX: Elevated white blood cell count, unspecified: D72.829

## 2022-02-24 LAB — PROTIME-INR
INR: 1.1 (ref 0.8–1.2)
Prothrombin Time: 13.7 seconds (ref 11.4–15.2)

## 2022-02-24 LAB — COMPREHENSIVE METABOLIC PANEL
ALT: 12 U/L (ref 0–44)
AST: 42 U/L — ABNORMAL HIGH (ref 15–41)
Albumin: 3.7 g/dL (ref 3.5–5.0)
Alkaline Phosphatase: 88 U/L (ref 38–126)
Anion gap: 7 (ref 5–15)
BUN: 14 mg/dL (ref 6–20)
CO2: 22 mmol/L (ref 22–32)
Calcium: 8.4 mg/dL — ABNORMAL LOW (ref 8.9–10.3)
Chloride: 105 mmol/L (ref 98–111)
Creatinine, Ser: 0.94 mg/dL (ref 0.44–1.00)
GFR, Estimated: >60 mL/min (ref >60)
Glucose, Bld: 152 mg/dL — ABNORMAL HIGH (ref 70–99)
Potassium: 3.7 mmol/L (ref 3.5–5.1)
Sodium: 134 mmol/L — ABNORMAL LOW (ref 135–145)
Total Bilirubin: 1.1 mg/dL (ref 0.3–1.2)
Total Protein: 8.2 g/dL — ABNORMAL HIGH (ref 6.5–8.1)

## 2022-02-24 LAB — CBC WITH DIFFERENTIAL/PLATELET
Abs Immature Granulocytes: 0.07 10*3/uL (ref 0.00–0.07)
Basophils Absolute: 0 10*3/uL (ref 0.0–0.1)
Basophils Relative: 0 %
Eosinophils Absolute: 0 10*3/uL (ref 0.0–0.5)
Eosinophils Relative: 0 %
HCT: 34.5 % — ABNORMAL LOW (ref 36.0–46.0)
Hemoglobin: 11.1 g/dL — ABNORMAL LOW (ref 12.0–15.0)
Immature Granulocytes: 1 %
Lymphocytes Relative: 8 %
Lymphs Abs: 1.1 10*3/uL (ref 0.7–4.0)
MCH: 28.9 pg (ref 26.0–34.0)
MCHC: 32.2 g/dL (ref 30.0–36.0)
MCV: 89.8 fL (ref 80.0–100.0)
Monocytes Absolute: 1.1 10*3/uL — ABNORMAL HIGH (ref 0.1–1.0)
Monocytes Relative: 9 %
Neutro Abs: 10.5 10*3/uL — ABNORMAL HIGH (ref 1.7–7.7)
Neutrophils Relative %: 82 %
Platelets: 284 10*3/uL (ref 150–400)
RBC: 3.84 MIL/uL — ABNORMAL LOW (ref 3.87–5.11)
RDW: 14.1 % (ref 11.5–15.5)
WBC: 12.8 10*3/uL — ABNORMAL HIGH (ref 4.0–10.5)
nRBC: 0 % (ref 0.0–0.2)

## 2022-02-24 LAB — HCG, QUANTITATIVE, PREGNANCY: hCG, Beta Chain, Quant, S: <1 m[IU]/mL (ref <5)

## 2022-02-24 LAB — MRSA NEXT GEN BY PCR, NASAL: MRSA by PCR Next Gen: NOT DETECTED

## 2022-02-24 LAB — APTT: aPTT: 38 seconds — ABNORMAL HIGH (ref 24–36)

## 2022-02-24 LAB — BLOOD GAS, VENOUS
Acid-Base Excess: 2.4 mmol/L — ABNORMAL HIGH (ref 0.0–2.0)
Bicarbonate: 27.2 mmol/L (ref 20.0–28.0)
O2 Saturation: 65.7 %
Patient temperature: 37
pCO2, Ven: 42 mmHg — ABNORMAL LOW (ref 44–60)
pH, Ven: 7.42 (ref 7.25–7.43)
pO2, Ven: 39 mmHg (ref 32–45)

## 2022-02-24 LAB — ACETAMINOPHEN LEVEL: Acetaminophen (Tylenol), Serum: <10 ug/mL — ABNORMAL LOW (ref 10–30)

## 2022-02-24 LAB — SALICYLATE LEVEL: Salicylate Lvl: <7 mg/dL — ABNORMAL LOW (ref 7.0–30.0)

## 2022-02-24 LAB — MAGNESIUM: Magnesium: 2.3 mg/dL (ref 1.7–2.4)

## 2022-02-24 LAB — LACTIC ACID, PLASMA
Lactic Acid, Venous: 3.2 mmol/L (ref 0.5–1.9)
Lactic Acid, Venous: 0.9 mmol/L (ref 0.5–1.9)

## 2022-02-24 LAB — ETHANOL: Alcohol, Ethyl (B): <10 mg/dL (ref <10)

## 2022-02-24 LAB — GLUCOSE, CAPILLARY: Glucose-Capillary: 81 mg/dL (ref 70–99)

## 2022-02-24 MED ORDER — CHLORHEXIDINE GLUCONATE CLOTH 2 % EX PADS
6.0000 | MEDICATED_PAD | Freq: Every day | CUTANEOUS | Status: DC
  Administered 2022-02-25: 6 via TOPICAL

## 2022-02-24 MED ORDER — SODIUM CHLORIDE 0.9 % IV SOLN: INTRAVENOUS | Status: DC

## 2022-02-24 MED ORDER — ACETAMINOPHEN 325 MG PO TABS
650.0000 mg | ORAL_TABLET | Freq: Four times a day (QID) | ORAL | Status: DC | PRN
  Administered 2022-02-26 – 2022-02-27 (×3): 650 mg via ORAL
  Filled 2022-02-24 (×3): qty 2

## 2022-02-24 MED ORDER — LORAZEPAM 2 MG/ML IJ SOLN
0.0000 mg | Freq: Four times a day (QID) | INTRAMUSCULAR | Status: AC
  Administered 2022-02-24 – 2022-02-25 (×3): 2 mg via INTRAVENOUS
  Filled 2022-02-24 (×4): qty 1

## 2022-02-24 MED ORDER — THIAMINE MONONITRATE 100 MG PO TABS
100.0000 mg | ORAL_TABLET | Freq: Every day | ORAL | Status: DC
  Administered 2022-02-24 – 2022-02-27 (×4): 100 mg via ORAL
  Filled 2022-02-24 (×4): qty 1

## 2022-02-24 MED ORDER — LORAZEPAM 2 MG/ML IJ SOLN: 2.0000 mg | INTRAMUSCULAR | Status: DC | PRN

## 2022-02-24 MED ORDER — ORAL CARE MOUTH RINSE: 15.0000 mL | OROMUCOSAL | Status: DC | PRN

## 2022-02-24 MED ORDER — ONDANSETRON HCL 4 MG/2ML IJ SOLN: 4.0000 mg | Freq: Three times a day (TID) | INTRAMUSCULAR | Status: DC | PRN

## 2022-02-24 MED ORDER — NICOTINE 21 MG/24HR TD PT24
21.0000 mg | MEDICATED_PATCH | Freq: Every day | TRANSDERMAL | Status: DC
  Administered 2022-02-24 – 2022-02-27 (×4): 21 mg via TRANSDERMAL
  Filled 2022-02-24 (×5): qty 1

## 2022-02-24 MED ORDER — THIAMINE HCL 100 MG/ML IJ SOLN
100.0000 mg | Freq: Every day | INTRAMUSCULAR | Status: DC
  Filled 2022-02-24 (×2): qty 2

## 2022-02-24 MED ORDER — DIAZEPAM 5 MG/ML IJ SOLN
10.0000 mg | Freq: Once | INTRAMUSCULAR | Status: AC
  Administered 2022-02-24: 10 mg via INTRAVENOUS
  Filled 2022-02-24: qty 2

## 2022-02-24 MED ORDER — HYDRALAZINE HCL 20 MG/ML IJ SOLN: 5.0000 mg | INTRAMUSCULAR | Status: DC | PRN

## 2022-02-24 MED ORDER — FOLIC ACID 1 MG PO TABS
1.0000 mg | ORAL_TABLET | Freq: Every day | ORAL | Status: DC
  Administered 2022-02-24 – 2022-02-27 (×4): 1 mg via ORAL
  Filled 2022-02-24 (×4): qty 1

## 2022-02-24 MED ORDER — ADULT MULTIVITAMIN W/MINERALS CH
1.0000 | ORAL_TABLET | Freq: Every day | ORAL | Status: DC
  Administered 2022-02-24 – 2022-02-27 (×4): 1 via ORAL
  Filled 2022-02-24 (×4): qty 1

## 2022-02-24 MED ORDER — LEVETIRACETAM IN NACL 1000 MG/100ML IV SOLN
1000.0000 mg | Freq: Two times a day (BID) | INTRAVENOUS | Status: DC
  Administered 2022-02-24 – 2022-02-26 (×4): 1000 mg via INTRAVENOUS
  Filled 2022-02-24 (×5): qty 100

## 2022-02-24 MED ORDER — LORAZEPAM 2 MG/ML IJ SOLN: 0.0000 mg | Freq: Two times a day (BID) | INTRAMUSCULAR | Status: DC

## 2022-02-24 NOTE — Consult Note (Signed)
Neurosurgery-New Consultation Evaluation 02/24/2022 [name] [id number]  Identifying Statement: Felicia Nelson is a 42 y.o. female from [place] [address] with seizure and head strike with subdural hematoma  Physician Requesting Consultation: Dr. Jacelyn Grip, ED  History of Present Illness: The patient is a 42 year old right-handed female past medical history significant for seizure disorder since her 12s not currently on medication for seizures who presented this morning via EMS after generalized seizures x 2 this morning the setting of ?EtOH.  Per available data patient had a seizure at a family member's house EMS had been called she refused transfer at that time then later suffered an additional generalized seizure at home positive loss conscious positive bowel loss of bladder function.  Presented via EMS reportedly AO x 2 and undergoing CT scan imaging of the head patient demonstrating bilateral acute subdural hematomas as well as trace traumatic subarachnoid hemorrhage of the frontal lobe.  Neurosurgery was consulted  The patient herself notes she has some dull ache and headache.  Otherwise she notes she has some tenderness to her left cheek area as well as right frontal scalp area.  Past Medical History:  History reviewed. No pertinent past medical history.  Social History: Social History   Socioeconomic History   Marital status: Single    Spouse name: Not on file   Number of children: Not on file   Years of education: Not on file   Highest education level: Not on file  Occupational History   Not on file  Tobacco Use   Smoking status: Every Day    Packs/day: 0.75    Years: 25.00    Total pack years: 18.75    Types: Cigarettes, Cigars   Smokeless tobacco: Never  Vaping Use   Vaping Use: Never used  Substance and Sexual Activity   Alcohol use: Yes    Alcohol/week: 6.0 standard drinks of alcohol    Types: 6 Glasses of wine per week    Comment: mostly weekend   Drug use: Not  Currently    Comment: lat used >30 yrs ago   Sexual activity: Not Currently    Birth control/protection: Surgical    Comment: BTL 8 yrs ago  Other Topics Concern   Not on file  Social History Narrative   Not on file   Social Determinants of Health   Financial Resource Strain: Not on file  Food Insecurity: Not on file  Transportation Needs: Not on file  Physical Activity: Not on file  Stress: Not on file  Social Connections: Not on file  Intimate Partner Violence: Not At Risk (11/20/2020)   Humiliation, Afraid, Rape, and Kick questionnaire    Fear of Current or Ex-Partner: No    Emotionally Abused: No    Physically Abused: No    Sexually Abused: No   Living arrangements (living alone, with partner): unknown  Family History: History reviewed. No pertinent family history.  Review of Systems:  Review of Systems - General ROS: Negative Psychological ROS: Negative Ophthalmic ROS: Negative ENT ROS: Negative Hematological and Lymphatic ROS: Negative  Endocrine ROS: Negative Respiratory ROS: Negative Cardiovascular ROS: Negative Gastrointestinal ROS: Negative Genito-Urinary ROS: Negative Musculoskeletal ROS: Negative Neurological ROS: patient notes pain in the right frontal scalp, left maxillary area Dermatological ROS: Negative  Physical Exam: BP (!) 145/90   Pulse 84   Temp 98.3 F (36.8 C) (Oral)   Resp 17   Ht '5\' 4"'$  (1.626 m)   Wt 54.4 kg   SpO2 98%   BMI  20.60 kg/m  Body mass index is 20.6 kg/m. Body surface area is 1.57 meters squared. General appearance: somewhat lethargic appearing, slurred speaking and requiring ocassional redirection, not consistently attending Head: Normocephalic, atraumatic save bruising over the left maxillary face area as well as tenderness but not obvious laceration or bruising to the right frontal scalp area. Eyes: Normal, EOM intact Oropharynx: Moist without lesions Neck: Supple, no tenderness Heart: Normal, regular rate and  rhythm, without murmur Lungs: Clear to auscultation, good air exchange Abdomen: Soft, nondistended Ext: patchy bruising over bilateral LE  Neurologic exam:  Mental status: somewhat lethargic, difficult attending but answers questions on repeated asking, AOx person, place, time Speech: somewhat slurred Cranial nerves:  II: Visual fields are full by confrontation, no ptosis III/IV/VI: extra-ocular motions intact bilaterally V/VII:no evidence of facial droop or weakness and facial sensation intact VIII: hearing normal XI: trapezius strength symmetric,  sternocleidomastoid strength symmetric XII: tongue strength symmetric  Motor:strength symmetric 5/5, normal muscle mass and tone in all extremities and no pronator drift Sensory: intact to light touch in all extremities Reflexes: 2+ and symmetric bilaterally for arms and legs Coordination: intact finger to nose Gait: normal   Laboratory: Results for orders placed or performed during the hospital encounter of 02/24/22  Comprehensive metabolic panel  Result Value Ref Range   Sodium 134 (L) 135 - 145 mmol/L   Potassium 3.7 3.5 - 5.1 mmol/L   Chloride 105 98 - 111 mmol/L   CO2 22 22 - 32 mmol/L   Glucose, Bld 152 (H) 70 - 99 mg/dL   BUN 14 6 - 20 mg/dL   Creatinine, Ser 0.94 0.44 - 1.00 mg/dL   Calcium 8.4 (L) 8.9 - 10.3 mg/dL   Total Protein 8.2 (H) 6.5 - 8.1 g/dL   Albumin 3.7 3.5 - 5.0 g/dL   AST 42 (H) 15 - 41 U/L   ALT 12 0 - 44 U/L   Alkaline Phosphatase 88 38 - 126 U/L   Total Bilirubin 1.1 0.3 - 1.2 mg/dL   GFR, Estimated >60 >60 mL/min   Anion gap 7 5 - 15  Ethanol  Result Value Ref Range   Alcohol, Ethyl (B) <10 <10 mg/dL  Lactic acid, plasma  Result Value Ref Range   Lactic Acid, Venous 3.2 (HH) 0.5 - 1.9 mmol/L  CBC with Differential  Result Value Ref Range   WBC 12.8 (H) 4.0 - 10.5 K/uL   RBC 3.84 (L) 3.87 - 5.11 MIL/uL   Hemoglobin 11.1 (L) 12.0 - 15.0 g/dL   HCT 34.5 (L) 36.0 - 46.0 %   MCV 89.8 80.0 - 100.0  fL   MCH 28.9 26.0 - 34.0 pg   MCHC 32.2 30.0 - 36.0 g/dL   RDW 14.1 11.5 - 15.5 %   Platelets 284 150 - 400 K/uL   nRBC 0.0 0.0 - 0.2 %   Neutrophils Relative % 82 %   Neutro Abs 10.5 (H) 1.7 - 7.7 K/uL   Lymphocytes Relative 8 %   Lymphs Abs 1.1 0.7 - 4.0 K/uL   Monocytes Relative 9 %   Monocytes Absolute 1.1 (H) 0.1 - 1.0 K/uL   Eosinophils Relative 0 %   Eosinophils Absolute 0.0 0.0 - 0.5 K/uL   Basophils Relative 0 %   Basophils Absolute 0.0 0.0 - 0.1 K/uL   Immature Granulocytes 1 %   Abs Immature Granulocytes 0.07 0.00 - 0.07 K/uL  Magnesium  Result Value Ref Range   Magnesium 2.3 1.7 - 2.4 mg/dL  hCG, quantitative, pregnancy  Result Value Ref Range   hCG, Beta Chain, Quant, S <1 <5 mIU/mL  Acetaminophen level  Result Value Ref Range   Acetaminophen (Tylenol), Serum <10 (L) 10 - 30 ug/mL  Salicylate level  Result Value Ref Range   Salicylate Lvl <6.1 (L) 7.0 - 30.0 mg/dL  Blood gas, venous  Result Value Ref Range   pH, Ven 7.42 7.25 - 7.43   pCO2, Ven 42 (L) 44 - 60 mmHg   pO2, Ven 39 32 - 45 mmHg   Bicarbonate 27.2 20.0 - 28.0 mmol/L   Acid-Base Excess 2.4 (H) 0.0 - 2.0 mmol/L   O2 Saturation 65.7 %   Patient temperature 37.0    Collection site VEIN    I personally reviewed labs  Imaging: CT scan of the head demonstrates approximately 3 to 4 mm acute right frontal convexity subdural hematoma with minimal flow.  Approximately 1 to 2 mm acute subdural hematoma over the left cerebral convexity minimal mass trace amount of left frontal traumatic subarachnoid hemorrhage no hydrocephalus no skull fracture final radiology read pending     Impression/Plan:  Overall the patient is a 42 year old female medical history significant for seizure disorder in the past not currently taking any medications who presented this morning with is like to generalized tonic-clonic seizures potentially in the setting of EtOH.  She had positive head strike has loss of consciousness in  the setting of low seizures now presenting with CT scan imaging demonstrating bilateral acute subdural hematomas slightly larger on the right from progressing in the left lower convexity minimal mass effect and midline shift.  There is also trace traumatic subarachnoid hemorrhage over the left frontal convexity.  Her neurologic exam feels somewhat lethargic is nonfocal and otherwise neurologically intact.  Does not currently take any anticoagulation agents or antiplatelet therapy  1.  Diagnosis seizures and bilateral acute subdural hematomas as well as traumatic subarachnoid hemorrhage  2.  Plan No acute neurosurgical intervention Would repeat CT scan in 6 hours to assess for interval change in the subdural hematomas or intracranial hemorrhage Would start with Keppra 1000 mg twice daily for seizure control, consult neurology for seizure management particularly in the setting of her distant history of seizures and current new seizures, antiepileptic regimen ultimately per neurology Would ultimately recommend the patient undergo MRI of the brain to assess for underlying etiology for seizure history, but again defer this more detailed workup to neurology SBP<160 goal Neurosurgery will continue to follow along  Thank you for this interesting consult  Luciano Cutter .Johnney Killian, MD Neurosurgery

## 2022-02-24 NOTE — ED Notes (Signed)
Pt to ct 

## 2022-02-24 NOTE — Progress Notes (Signed)
Transported to CT via bed

## 2022-02-24 NOTE — H&P (Addendum)
History and Physical    Felicia Nelson [id number]:[id number] DOB: [birth year] DOA: 02/24/2022  Referring MD/NP/PA:   PCP: Pcp, No   Patient coming from:  The patient is coming from home.  At baseline, pt is independent for most of ADL.        Chief Complaint: Seizure, head injury  HPI: Felicia Nelson is a 42 y.o. female with medical history significant of seizure not taking medications, depression, polysubstance abuse, cocaine abuse, tobacco abuse, alcohol use, cervical cancer (s/p of LEEP), who presents with seizure.   Per report, pt had one episode of seizure around 8 AM this morning and when EMS arrived she refused transport.  She had another witnessed seizure while she was in the car with her family. EMS reports that she was somnolent and postictal.  No incontinence or tongue bite. She was given 1g of keppra in ED, no more seizure in ED. When I saw pt in ED, she is drowsy, looks tired, but is orientated x 3.  She moves all extremities normally.  No facial droop or slurred speech.  Patient denies chest pain, cough, shortness breath.  No nausea, vomiting, diarrhea or abdominal pain.  Denies symptoms of UTI.  Patient states that she drinks some alcohol late last night, then fell yesterday morning, injured her head.  She complains of headache on the occipital are and top of the head.  Data reviewed independently and ED Course: pt was found to have WBC 12.8, negative pregnancy test, Tylenol level less than 10, salicylate level less than 7, alcohol level less than 10, GFR> 60.  Temperature normal, blood pressure 128/83, heart rate 100, 83, RR 22, oxygen saturation 99% on room air.  Patient is admitted to stepdown as inpatient.  Dr. Johnney Killian of neurosurgery and Dr. Leonel Ramsay of neurology are consulted.  CT-head at 13:50 1. Bilateral hyperdense extra-axial collections compatible with acute subdural hematomas. 2. Subcortical hemorrhage in the anterior left frontal lobe concerning for shear injury. 3. Focal  hyperdensity along the anterior inferior left cerebellum likely represents additional hemorrhage. 4. Focal left supraorbital soft tissue swelling without underlying fracture.  EKG: I have personally reviewed.  Sinus rhythm, QTc 453, no ischemic change.   Review of Systems:   General: no fevers, chills, no body weight gain, has fatigue, has headache HEENT: no blurry vision, hearing changes or sore throat Respiratory: no dyspnea, coughing, wheezing CV: no chest pain, no palpitations GI: no nausea, vomiting, abdominal pain, diarrhea, constipation GU: no dysuria, burning on urination, increased urinary frequency, hematuria  Ext: no leg edema Neuro: no unilateral weakness, numbness, or tingling, no vision change or hearing loss. Has seizure and fall Skin: no rash, no skin tear. MSK: No muscle spasm, no deformity, no limitation of range of movement in spin Heme: No easy bruising.  Travel history: No recent long distant travel.   Allergy:  Allergies  Allergen Reactions   Penicillins     Other Reaction(s): Other (See Comments), Unknown  Was told as a child she was allergic to penicillin    Past Medical History:  Diagnosis Date   Alcohol use    Cervical cancer ([place])    Depression    Polysubstance abuse ([place])    Seizure ([place])    Tobacco abuse     Past Surgical History:  Procedure Laterality Date   cervical cancer, LEEP     CESAREAN SECTION      Social History:  reports that she has been smoking cigarettes and cigars. She has a  18.75 pack-year smoking history. She has never used smokeless tobacco. She reports current alcohol use of about 6.0 standard drinks of alcohol per week. She reports current drug use. Drug: "Crack" cocaine.  Family History:  Family History  Problem Relation Age of Onset   Diabetes Mother      Prior to Admission medications   Medication Sig Start Date End Date Taking? Authorizing Provider  traMADol (ULTRAM) 50 MG tablet Take 1 tablet (50 mg total)  by mouth every 6 (six) hours as needed. Patient not taking: Reported on 11/20/2020 07/15/16   Loney Hering, MD    Physical Exam: Vitals:   02/24/22 1743 02/24/22 1800 02/24/22 1815 02/24/22 1900  BP:  130/84  (!) 141/87  Pulse: 96 96 100 (!) 116  Resp: (!) 22 (!) 22  (!) 23  Temp:      TempSrc:      SpO2: 100% 100%  97%  Weight:      Height:       General: Not in acute distress HEENT:       Eyes: PERRL, EOMI, no scleral icterus.       ENT: No discharge from the ears and nose, no pharynx injection, no tonsillar enlargement.        Neck: No JVD, no bruit, no mass felt. Heme: No neck lymph node enlargement. Cardiac: S1/S2, RRR, No murmurs, No gallops or rubs. Respiratory: No rales, wheezing, rhonchi or rubs. GI: Soft, nondistended, nontender, no rebound pain, no organomegaly, BS present. GU: No hematuria Ext: No pitting leg edema bilaterally. 1+DP/PT pulse bilaterally. Musculoskeletal: No joint deformities, No joint redness or warmth, no limitation of ROM in spin. Skin: No rashes.  Neuro: Drowsy, easily arousable, oriented X3, cranial nerves II-XII grossly intact, moves all extremities normally.  Psych: Patient is not psychotic, no suicidal or hemocidal ideation.  Labs on Admission: I have personally reviewed following labs and imaging studies  CBC: Recent Labs  Lab 02/24/22 1243  WBC 12.8*  NEUTROABS 10.5*  HGB 11.1*  HCT 34.5*  MCV 89.8  PLT 355   Basic Metabolic Panel: Recent Labs  Lab 02/24/22 1243  NA 134*  K 3.7  CL 105  CO2 22  GLUCOSE 152*  BUN 14  CREATININE 0.94  CALCIUM 8.4*  MG 2.3   GFR: Estimated Creatinine Clearance: 66.3 mL/min (by C-G formula based on SCr of 0.94 mg/dL). Liver Function Tests: Recent Labs  Lab 02/24/22 1243  AST 42*  ALT 12  ALKPHOS 88  BILITOT 1.1  PROT 8.2*  ALBUMIN 3.7   No results for input(s): "LIPASE", "AMYLASE" in the last 168 hours. No results for input(s): "AMMONIA" in the last 168 hours. Coagulation  Profile: Recent Labs  Lab 02/24/22 1625  INR 1.1   Cardiac Enzymes: No results for input(s): "CKTOTAL", "CKMB", "CKMBINDEX", "TROPONINI" in the last 168 hours. BNP (last 3 results) No results for input(s): "PROBNP" in the last 8760 hours. HbA1C: No results for input(s): "HGBA1C" in the last 72 hours. CBG: Recent Labs  Lab 02/24/22 1627  GLUCAP 81   Lipid Profile: No results for input(s): "CHOL", "HDL", "LDLCALC", "TRIG", "CHOLHDL", "LDLDIRECT" in the last 72 hours. Thyroid Function Tests: No results for input(s): "TSH", "T4TOTAL", "FREET4", "T3FREE", "THYROIDAB" in the last 72 hours. Anemia Panel: No results for input(s): "VITAMINB12", "FOLATE", "FERRITIN", "TIBC", "IRON", "RETICCTPCT" in the last 72 hours. Urine analysis:    Component Value Date/Time   COLORURINE Yellow 02/27/2014 1618   APPEARANCEUR Cloudy 02/27/2014 1618   LABSPEC  1.017 02/27/2014 1618   PHURINE 5.0 02/27/2014 1618   GLUCOSEU Negative 02/27/2014 1618   HGBUR 1+ 02/27/2014 1618   BILIRUBINUR Negative 02/27/2014 1618   KETONESUR Negative 02/27/2014 1618   PROTEINUR 100 mg/dL 02/27/2014 1618   NITRITE Positive 02/27/2014 1618   LEUKOCYTESUR 2+ 02/27/2014 1618   Sepsis Labs: '@LABRCNTIP'$ (procalcitonin:4,lacticidven:4) ) Recent Results (from the past 240 hour(s))  MRSA Next Gen by PCR, Nasal     Status: None   Collection Time: 02/24/22  4:29 PM   Specimen: Nasal Mucosa; Nasal Swab  Result Value Ref Range Status   MRSA by PCR Next Gen NOT DETECTED NOT DETECTED Final    Comment: (NOTE) The GeneXpert MRSA Assay (FDA approved for NASAL specimens only), is one component of a comprehensive MRSA colonization surveillance program. It is not intended to diagnose MRSA infection nor to guide or monitor treatment for MRSA infections. Test performance is not FDA approved in patients less than 76 years old. Performed at [facility], [address] [place], [place] [address]      Radiological Exams  on Admission: CT Head Wo Contrast  Result Date: 02/24/2022 CLINICAL DATA:  Follow-up examination for subdural hemorrhage. EXAM: CT HEAD WITHOUT CONTRAST TECHNIQUE: Contiguous axial images were obtained from the base of the skull through the vertex without intravenous contrast. RADIATION DOSE REDUCTION: This exam was performed according to the departmental dose-optimization program which includes automated exposure control, adjustment of the mA and/or kV according to patient size and/or use of iterative reconstruction technique. COMPARISON:  Prior CT from earlier the same day. FINDINGS: Brain: Previously identified bilateral subdural hematomas again seen, little interval changed measuring up to 8 mm on the right and 4 mm on the left. Small parafalcine component now seen. No significant mass effect or midline shift. Small hemorrhagic contusion at the anterior left frontal lobe also similar measuring 1 cm. Minimal surrounding edema without mass effect. Possible additional small hemorrhage at the anterior/inferior left cerebellum, unchanged. No other new intracranial hemorrhage. No acute large vessel territory infarct. No mass lesion. No hydrocephalus. Basilar cisterns remain patent. Vascular: No abnormal hyperdense vessel. Skull: Scalp soft tissues and calvarium demonstrate no new finding. Sinuses/Orbits: Globes orbital soft tissues demonstrate no acute finding. Chronic right maxillary sinusitis noted. Mastoid air cells remain clear. Other: None. IMPRESSION: 1. No significant interval change in size of bilateral subdural hematomas, right greater than left. Small parafalcine component now seen. No significant mass effect or midline shift. 2. Similar 1 cm hemorrhagic contusion/shear injury at the anterior left frontal lobe. Additional possible small hemorrhage at the inferior left cerebellum, also unchanged. 3. No other new acute intracranial abnormality. 4. Chronic right maxillary sinusitis. Electronically Signed    By: Jeannine Boga M.D.   On: 02/24/2022 20:12   CT Head Wo Contrast  Result Date: 02/24/2022 CLINICAL DATA:  New onset seizure.  Possible trauma to head. EXAM: CT HEAD WITHOUT CONTRAST TECHNIQUE: Contiguous axial images were obtained from the base of the skull through the vertex without intravenous contrast. RADIATION DOSE REDUCTION: This exam was performed according to the departmental dose-optimization program which includes automated exposure control, adjustment of the mA and/or kV according to patient size and/or use of iterative reconstruction technique. COMPARISON:  CT head without contrast 07/15/2016 FINDINGS: Brain: Bilateral hyperdense extra-axial collections are present maximum thickness on the right is 7 mm anteriorly. The left-sided collection measures 3.5 mm maximally. Subcortical hemorrhage is noted in the anterior left frontal lobe on image 19 of series 3. No other  definite parenchymal hemorrhage is present. Blood extends along the anterior falx. No acute or focal cortical abnormalities are present. Basal ganglia are intact. The ventricles are of normal size. Brainstem is within normal limits. Focal hyperdensity along the anterior inferior left cerebellum likely represents additional hemorrhage. Cerebellum is otherwise within normal limits. Vascular: No hyperdense vessel or unexpected calcification. Skull: Focal left supraorbital soft tissue swelling is present without underlying fracture. No other significant extracranial soft tissue injury is present. No acute fractures are present. Sinuses/Orbits: The right maxillary sinus is opacified. Scattered opacification of right ethmoid air cells is present. The paranasal sinuses and mastoid air cells are otherwise clear. The globes and orbits are within normal limits. IMPRESSION: 1. Bilateral hyperdense extra-axial collections compatible with acute subdural hematomas. 2. Subcortical hemorrhage in the anterior left frontal lobe concerning for  shear injury. 3. Focal hyperdensity along the anterior inferior left cerebellum likely represents additional hemorrhage. 4. Focal left supraorbital soft tissue swelling without underlying fracture. Critical Value/emergent results were called by telephone at the time of interpretation on 02/24/2022 at 1:58 pm to provider [facility] , who verbally acknowledged these results. Electronically Signed   By: San Morelle M.D.   On: 02/24/2022 14:01      Assessment/Plan Principal Problem:   SDH (subdural hematoma) (HCC) Active Problems:   Seizure (HCC)   Depression   Elevated lactic acid level   Fall at home, initial encounter   Tobacco abuse   Alcohol use   Polysubstance abuse (HCC)   Leukocytosis   Assessment and Plan:  SDH (subdural hematoma) and SAH (subarachnoid hemorrhage):  CT showed bilateral acute subdural hematomas, subcortical hemorrhage in the anterior left frontal lobe concerning for shear injury, possible anterior inferior left cerebellum hemorrhage. Dr. Johnney Killian of neurosurgery is consulted.  He recommended to repeat CT scan in 6 hours and start Keppra 1 g twice daily  -will admit to SDU as inpt -repeat CT of head in 6 hours at 19: 35 -Frequent neurocheck  Seizure: Consulted Dr. Leonel Ramsay of neurology as recommended by Dr. Johnney Killian of neurosurgery. Dr. Leonel Ramsay recommended to hold off on MRI of brain and continue Keppra -Seizure precaution -When necessary Ativan for seizure -Keppra 100 mg twice daily  Depression: Patient denies suicidal homicidal ideations.  Not taking medications currently -Observe closely  Elevated lactic acid level: Lactic acid 3.2, which is normalized to 0.9.  Most likely due to seizure  Fall at home, initial encounter: Possibly related to alcohol use -Fall precaution  Polysubstance abuse, tobacco abuse, Alcohol use and cocaine abuse: -Did counseling about importance of quitting substance use -Check UDS -Nicotine patch -CIWA  protocol  Leukocytosis: WBC 12.8.  No fever, no source of infection identified, likely reactive -Follow-up urinalysis       DVT ppx: SCD  Code Status: Full code  Family Communication: I have tried to call her uncle who did not pick up the phone, I left a message to him  Disposition Plan:  Anticipate discharge back to previous environment  Consults called:  Dr. Johnney Killian of neurosurgery and Dr. Leonel Ramsay of neurology are consulted.  Admission status and Level of care: Stepdown:  as inpt     Dispo: The patient is from: Home              Anticipated d/c is to: Home              Anticipated d/c date is: 2 days              Patient  currently is not medically stable to d/c.    Severity of Illness:  The appropriate patient status for this patient is INPATIENT. Inpatient status is judged to be reasonable and necessary in order to provide the required intensity of service to ensure the patient's safety. The patient's presenting symptoms, physical exam findings, and initial radiographic and laboratory data in the context of their chronic comorbidities is felt to place them at high risk for further clinical deterioration. Furthermore, it is not anticipated that the patient will be medically stable for discharge from the hospital within 2 midnights of admission.   * I certify that at the point of admission it is my clinical judgment that the patient will require inpatient hospital care spanning beyond 2 midnights from the point of admission due to high intensity of service, high risk for further deterioration and high frequency of surveillance required.*       Date of Service 02/24/2022    Ivor Costa [facility]   If 7PM-7AM, please contact night-coverage [email or web address] 02/24/2022, 8:25 PM

## 2022-02-24 NOTE — ED Notes (Signed)
Pt back from CT

## 2022-02-24 NOTE — ED Notes (Signed)
Date and time results received: 02/24/22 1356  Test: lactic acid Critical Value: 3.2   Name of Provider Notified: Lucillie Garfinkel, MD

## 2022-02-24 NOTE — ED Notes (Signed)
Called to give ICU report, RN was transporting pt to another area of hospital and will call me back.

## 2022-02-24 NOTE — Progress Notes (Signed)
Patient has substance use history from October of 2023. CSW does not see in the notes at this time any current substance use issues. Urine drug screen is pending.

## 2022-02-24 NOTE — ED Notes (Signed)
Advised nurse  patient has ready bed

## 2022-02-24 NOTE — ED Provider Notes (Signed)
[facility] Provider Note    Event Date/Time   First MD Initiated Contact with Patient 02/24/22 1231     (approximate)   History   Seizures   HPI  Felicia Nelson is a 42 y.o. female   Past medical history of etoh use, PTSD and depression, presents to the emergency department with seizure-like activity from home.  She had 1 episode around 8 AM this morning and when EMS arrived she refused transport.  She had another witnessed seizure-like activity while in the car with her family and was transported by EMS to the emergency department after the second episode.  EMS reports that she was somnolent and postictal.  No incontinence or tongue bite.  Patient has never been medically evaluated for seizures in the past but believes that she has had similar episodes years ago.  States that she still drinks alcohol though not daily but did have several shots of tequila last night.  She denies any other recent medical complaints or illnesses.  No drug use.  After finding evidence of intracranial bleeding on CT scan, I reinterviewed the patient and came to light that she did have a fall yesterday morning and head injury.  She had a normal day after that and even went out to go drinking last night.  External Medical Documents Reviewed: [facility] past medical records indicating psychiatric hospitalization in October 2023 as well as alcohol use history      Physical Exam   Triage Vital Signs: ED Triage Vitals  Enc Vitals Group     BP --      Pulse Rate 02/24/22 1229 99     Resp 02/24/22 1229 17     Temp 02/24/22 1229 98.3 F (36.8 C)     Temp Source 02/24/22 1229 Oral     SpO2 02/24/22 1229 98 %     Weight --      Height --      Head Circumference --      Peak Flow --      Pain Score 02/24/22 1230 0     Pain Loc --      Pain Edu? --      Excl. in [place]? --     Most recent vital signs: Vitals:   02/24/22 1325 02/24/22 1508  BP:  128/83  Pulse: 84 83  Resp:  (!)  22  Temp:    SpO2:  99%    General: Awake, no distress.  CV:  Good peripheral perfusion.  Resp:  Normal effort.  Abd:  No distention. Other:  Sleepy appearing, but increasingly more awake during her hospitalization in the ED.  She is able to move all extremities and sensations intact no facial asymmetry no dysarthria neck is supple with full range of motion lungs clear abdomen soft and nontender and no significant signs of trauma on my exam to head neck torso or extremities.  She has no significant hand tremors or tongue fasciculation and her hemodynamics are appropriate and reassuring.   ED Results / Procedures / Treatments   Labs (all labs ordered are listed, but only abnormal results are displayed) Labs Reviewed  COMPREHENSIVE METABOLIC PANEL - Abnormal; Notable for the following components:      Result Value   Sodium 134 (*)    Glucose, Bld 152 (*)    Calcium 8.4 (*)    Total Protein 8.2 (*)    AST 42 (*)    All other components within normal limits  LACTIC ACID, PLASMA - Abnormal; Notable for the following components:   Lactic Acid, Venous 3.2 (*)    All other components within normal limits  CBC WITH DIFFERENTIAL/PLATELET - Abnormal; Notable for the following components:   WBC 12.8 (*)    RBC 3.84 (*)    Hemoglobin 11.1 (*)    HCT 34.5 (*)    Neutro Abs 10.5 (*)    Monocytes Absolute 1.1 (*)    All other components within normal limits  ACETAMINOPHEN LEVEL - Abnormal; Notable for the following components:   Acetaminophen (Tylenol), Serum <10 (*)    All other components within normal limits  SALICYLATE LEVEL - Abnormal; Notable for the following components:   Salicylate Lvl <8.1 (*)    All other components within normal limits  BLOOD GAS, VENOUS - Abnormal; Notable for the following components:   pCO2, Ven 42 (*)    Acid-Base Excess 2.4 (*)    All other components within normal limits  ETHANOL  MAGNESIUM  HCG, QUANTITATIVE, PREGNANCY  LACTIC ACID, PLASMA  URINE  DRUG SCREEN, QUALITATIVE ([facility] ONLY)  PREGNANCY, URINE     I ordered and reviewed the above labs they are notable for lactic acidosis 3+.  Negative Tylenol and salicylate level.  White blood cell count is mildly elevated at 12.8.  EKG  ED ECG REPORT I, Lucillie Garfinkel, the attending physician, personally viewed and interpreted this ECG.   Date: 02/24/2022  EKG Time: 1232  Rate: 94  Rhythm: nsr  Axis: nl  Intervals:none  ST&T Change: no stemi    RADIOLOGY I independently reviewed and interpreted CT of the head and see bilateral subdural   PROCEDURES:  Critical Care performed: Yes, see critical care procedure note(s)  .Critical Care  Performed by: Lucillie Garfinkel, MD Authorized by: Lucillie Garfinkel, MD   Critical care provider statement:    Critical care time (minutes):  30   Critical care was necessary to treat or prevent imminent or life-threatening deterioration of the following conditions:  Trauma   Critical care was time spent personally by me on the following activities:  Development of treatment plan with patient or surrogate, discussions with consultants, evaluation of patient's response to treatment, examination of patient, ordering and review of laboratory studies, ordering and review of radiographic studies, ordering and performing treatments and interventions, pulse oximetry, re-evaluation of patient's condition and review of old charts    MEDICATIONS ORDERED IN ED: Medications  thiamine (VITAMIN B1) tablet 100 mg (100 mg Oral Given 02/24/22 1324)    Or  thiamine (VITAMIN B1) injection 100 mg ( Intravenous See Alternative 0/17/51 0258)  folic acid (FOLVITE) tablet 1 mg (1 mg Oral Given 02/24/22 1324)  multivitamin with minerals tablet 1 tablet (1 tablet Oral Given 02/24/22 1324)  LORazepam (ATIVAN) injection 0-4 mg ( Intravenous Not Given 02/24/22 1447)    Followed by  LORazepam (ATIVAN) injection 0-4 mg (has no administration in time range)  levETIRAcetam (KEPPRA) IVPB  1000 mg/100 mL premix (0 mg Intravenous Stopped 02/24/22 1518)  diazepam (VALIUM) injection 10 mg (10 mg Intravenous Given 02/24/22 1327)    External physician / consultants:  I spoke with Dr. Johnney Killian of neurosurgery regarding care plan for this patient.   IMPRESSION / MDM / ASSESSMENT AND PLAN / ED COURSE  I reviewed the triage vital signs and the nursing notes.  Patient's presentation is most consistent with acute presentation with potential threat to life or bodily function.  Differential diagnosis includes, but is not limited to, seizure, electrolyte disturbance, alcohol withdrawal, ICH   The patient is on the cardiac monitor to evaluate for evidence of arrhythmia and/or significant heart rate changes.  MDM: Patient with first medical evaluation for seizure-like activity, postictal state and elevated lactic most likely a real seizure with reported tonic-clonic activity.  At first thought to be alcohol withdrawal seizures but not clinically withdrawing from my evaluation though she states that he did drink a significant amount of alcohol yesterday.  I gave her 10 of Valium and put her on CIWA.  Ordered basic labs, toxicologic labs, and a CT of the head.  CT of the head revealed bilateral subdurals and a subcortical frontal lobe parenchymal hemorrhage with shear like injuries, she then states that she did have a fall with head strike yesterday morning which is likely the cause of the head bleed.  Patient remains neuro intact and is alert and oriented following all commands and complains of a mild headache at this time.  Consultation with Dr. Johnney Killian of neurosurgery plan for repeat head CT 6 hours and start on Keppra 1 g twice daily.  Blood pressure has been well-controlled and remained systolic 967 without need for medication.  I reassessed the patient who remained somnolent but wakes and follows commands and answers questions appropriately.  She remains on CIWA  and has no obvious signs of withdrawal at this time and had no more seizure activity during her stay.  Given her alcohol use and questionable alcohol withdrawal on CIWA as well as significant head bleed with seizures I will admit this patient        FINAL CLINICAL IMPRESSION(S) / ED DIAGNOSES   Final diagnoses:  Seizure ([place])  Alcohol use  Intracranial bleed ([place])     Rx / DC Orders   ED Discharge Orders     None        Note:  This document was prepared using Dragon voice recognition software and may include unintentional dictation errors.    Lucillie Garfinkel, MD 02/24/22 [phone]

## 2022-02-24 NOTE — ED Triage Notes (Addendum)
Pt arrived via [facility] with c/o seizure this morning. Per EMS they were called to the store and per pt family she had a full body seizure x2 today. EMS checked her out this morning after first potential seizure but refused hospital care at that time. Per EMS pt was postictal for 10 mins and then became A&Ox2. Pt states they believe they hit their head when they had the first seizure this morning, but is unsure because "they were in and out of it."

## 2022-02-25 ENCOUNTER — Other Ambulatory Visit: Payer: Self-pay | Admitting: Neurosurgery

## 2022-02-25 DIAGNOSIS — S065XAA Traumatic subdural hemorrhage with loss of consciousness status unknown, initial encounter: Secondary | ICD-10-CM

## 2022-02-25 LAB — BASIC METABOLIC PANEL
Anion gap: 9 (ref 5–15)
BUN: 9 mg/dL (ref 6–20)
CO2: 23 mmol/L (ref 22–32)
Calcium: 8.2 mg/dL — ABNORMAL LOW (ref 8.9–10.3)
Chloride: 104 mmol/L (ref 98–111)
Creatinine, Ser: 0.7 mg/dL (ref 0.44–1.00)
GFR, Estimated: >60 mL/min (ref >60)
Glucose, Bld: 100 mg/dL — ABNORMAL HIGH (ref 70–99)
Potassium: 3.4 mmol/L — ABNORMAL LOW (ref 3.5–5.1)
Sodium: 136 mmol/L (ref 135–145)

## 2022-02-25 LAB — CBC
HCT: 30.3 % — ABNORMAL LOW (ref 36.0–46.0)
Hemoglobin: 9.8 g/dL — ABNORMAL LOW (ref 12.0–15.0)
MCH: 28.7 pg (ref 26.0–34.0)
MCHC: 32.3 g/dL (ref 30.0–36.0)
MCV: 88.6 fL (ref 80.0–100.0)
Platelets: 241 10*3/uL (ref 150–400)
RBC: 3.42 MIL/uL — ABNORMAL LOW (ref 3.87–5.11)
RDW: 14.3 % (ref 11.5–15.5)
WBC: 9.5 10*3/uL (ref 4.0–10.5)
nRBC: 0 % (ref 0.0–0.2)

## 2022-02-25 LAB — URINALYSIS, COMPLETE (UACMP) WITH MICROSCOPIC
Bilirubin Urine: NEGATIVE
Glucose, UA: NEGATIVE mg/dL
Ketones, ur: NEGATIVE mg/dL
Leukocytes,Ua: NEGATIVE
Nitrite: NEGATIVE
Protein, ur: 30 mg/dL — AB
Specific Gravity, Urine: 1.018 (ref 1.005–1.030)
pH: 7 (ref 5.0–8.0)

## 2022-02-25 LAB — URINE DRUG SCREEN, QUALITATIVE (ARMC ONLY)
Amphetamines, Ur Screen: NOT DETECTED
Barbiturates, Ur Screen: NOT DETECTED
Benzodiazepine, Ur Scrn: POSITIVE — AB
Cannabinoid 50 Ng, Ur ~~LOC~~: NOT DETECTED
Cocaine Metabolite,Ur ~~LOC~~: POSITIVE — AB
MDMA (Ecstasy)Ur Screen: NOT DETECTED
Methadone Scn, Ur: NOT DETECTED
Opiate, Ur Screen: NOT DETECTED
Phencyclidine (PCP) Ur S: NOT DETECTED
Tricyclic, Ur Screen: NOT DETECTED

## 2022-02-25 LAB — HIV ANTIBODY (ROUTINE TESTING W REFLEX): HIV Screen 4th Generation wRfx: NONREACTIVE

## 2022-02-25 NOTE — TOC Initial Note (Signed)
Transition of Care [facility]) - Initial/Assessment Note    Patient Details  Name: Felicia Nelson MRN: [id number] Date of Birth: [birth year]  [facility] CM/SW Contact:    Shelbie Hutching, RN Phone Number: 02/25/2022, 4:35 PM  Clinical Narrative:                 Patient admitted to the hospital after having a seizure and hitting her head, patient with subdural hematoma.  RNCM met with patient at the bedside, introduced self and explained reason for visit.  Patient reports that she lives with her mom, when asked about living in a tent she replies that she chooses to stay in the tent for privacy.  She reports that she could stay in the house if she wanted to.   Patient reports having 10 children and wanting to get back to her family.  Patient says she does not like being in this hospital room. Patient reports that she will have someone from her family for transportation home at discharge.   Expected Discharge Plan: Home/Self Care Barriers to Discharge: Continued Medical Work up   Patient Goals and CMS Choice Patient states their goals for this hospitalization and ongoing recovery are:: Patient wants to go home, doesn't like being couped up in this room          Expected Discharge Plan and Services   Discharge Planning Services: CM Consult   Living arrangements for the past 2 months: Single Family Home                 DME Arranged: N/A         HH Arranged: NA [place]: NA        Prior Living Arrangements/Services Living arrangements for the past 2 months: Single Family Home Lives with:: Parents Patient language and need for interpreter reviewed:: Yes Do you feel safe going back to the place where you live?: Yes      Need for Family Participation in Patient Care: Yes (Comment) Care giver support system in place?: Yes (comment)   Criminal Activity/Legal Involvement Pertinent to Current Situation/Hospitalization: No - Comment as needed  Activities of Daily  Living Home Assistive Devices/Equipment: None ADL Screening (condition at time of admission) Patient's cognitive ability adequate to safely complete daily activities?: Yes Is the patient deaf or have difficulty hearing?: No Does the patient have difficulty seeing, even when wearing glasses/contacts?: No Does the patient have difficulty concentrating, remembering, or making decisions?: No Patient able to express need for assistance with ADLs?: Yes Does the patient have difficulty dressing or bathing?: No Independently performs ADLs?: Yes (appropriate for developmental age) Does the patient have difficulty walking or climbing stairs?: No Weakness of Legs: None Weakness of Arms/Hands: None  Permission Sought/Granted   Permission granted to share information with : No              Emotional Assessment Appearance:: Disheveled, Appears older than stated age Attitude/Demeanor/Rapport: Avoidant Affect (typically observed): Anxious Orientation: : Oriented to Self, Oriented to Place, Oriented to  Time, Oriented to Situation Alcohol / Substance Use: Illicit Drugs Psych Involvement: No (comment)  Admission diagnosis:  Seizure ([place]) [R56.9] SDH (subdural hematoma) (HCC) [S06.5XAA] Alcohol use [Z78.9] Intracranial bleed [facility]) [I62.9] Patient Active Problem List   Diagnosis Date Noted   SDH (subdural hematoma) ([place]) 02/24/2022   Tobacco abuse 02/24/2022   Depression 02/24/2022   Seizure ([place]) 02/24/2022   Alcohol use 02/24/2022   Cervical cancer ([place]) 02/24/2022   Elevated  lactic acid level 02/24/2022   Fall at home, initial encounter 02/24/2022   Polysubstance abuse ([place]) 02/24/2022   Leukocytosis 02/24/2022   History of cervical cancer 11/20/2020   PCP:  Pcp, No Pharmacy:  No Pharmacies Listed    Social Determinants of Health (SDOH) Social History: SDOH Screenings   Food Insecurity: No Food Insecurity (02/24/2022)  Housing: High Risk (02/24/2022)  Transportation Needs: No  Transportation Needs (02/24/2022)  Utilities: Not At Risk (02/24/2022)  Tobacco Use: High Risk (02/24/2022)   SDOH Interventions:     Readmission Risk Interventions     No data to display

## 2022-02-25 NOTE — Progress Notes (Signed)
Progress Note   Patient: Felicia Nelson OVF:[id number] DOB: Sep 11, 1980 DOA: 02/24/2022     1 DOS: the patient was seen and examined on 02/25/2022   Brief hospital course: Felicia Nelson is a 42 y.o. female with medical history significant of seizure not taking medications, depression, polysubstance abuse, cocaine abuse, tobacco abuse, alcohol use, cervical cancer (s/p of LEEP), who presented via EMS on 02/24/2022 with seizures.   On admission, she reported that she drinks, had some alcohol the night before, then fell in the  morning, injured her head.  She complains of headache on the occipital are and top of the head.   Per report, pt had one episode of seizure around 8 AM, EMS was called but she refused transport.  She had another witnessed seizure while she was in the car with her family. EMS reports that she was somnolent and postictal.  No incontinence or tongue bite.  Loaded with IV Keppra in the ED.    CT head showed bilateral acute subdural hematomas and possible shear injury, soft tissues swelling of left supraorbital area.    Admitted to stepdown as inpatient.  Neurosurgery and Neurology are consulted.  Repeat head CT after 6 hours was obtained and stable.  1/29: pt very somnolent and not very arousable on rounds this AM.  Assessment and Plan: * SDH (subdural hematoma) (HCC)  CT showed bilateral acute subdural hematomas, subcortical hemorrhage in the anterior left frontal lobe concerning for shear injury, possible anterior inferior left cerebellum hemorrhage. Dr. Johnney Killian of neurosurgery is consulted.  Repeat CT scan at 6 hours was stable --Continue IV Keppra 1000 mg BID --Follow up neurology and neurosurgery recommendations --Frequent neurocheck  Seizure ([place]) Due to subdural hematomas most likley. Continue IV Keppra 1000 mg BID Appreciate neurology recs PRN Ativan seizure Seizure precautions  Depression Appears not on medications. Monitor.  Fall at home, initial  encounter Likely due to intoxication, alcohol. Fall precautions. Will get PT eval if needed once she's awake and ready for OOB activity.  Elevated lactic acid level Likely from seizure. Initial lactate 3.2, normalized to 0.9. No s/sx's of infection.  Polysubstance abuse ([place]) -Did counseling about importance of quitting substance use -Check UDS -Nicotine patch -CIWA protocol  Alcohol use Monitor on CIWA PRN Ativan   Tobacco abuse Nicotine patch  Leukocytosis Likely reactive. Monitor fever curve, CBC        Subjective: Pt seen in ICU this AM, somnolent and difficult to awaken. She will respond but quickly falls back to sleep. Denies acute complaints, none reported.  Physical Exam: Vitals:   02/25/22 0900 02/25/22 1000 02/25/22 1100 02/25/22 1200  BP: (!) 142/96 (!) 142/95 (!) 147/96 (!) 147/101  Pulse: (!) 110 99 (!) 102 98  Resp: (!) 26 (!) 24 (!) 25 19  Temp: 98.2 F (36.8 C)   98 F (36.7 C)  TempSrc: Oral     SpO2: 100% 99% 98% 99%  Weight:      Height:       General exam: awake, alert, no acute distress HEENT: keeps eyes closed, hearing grossly normal  Respiratory system: CTAB, no wheezes, rales or rhonchi, normal respiratory effort. Cardiovascular system: normal S1/S2, RRR, no JVD, murmurs, rubs, gallops,  no pedal edema.   Gastrointestinal system: soft, NT, ND, no HSM felt, +bowel sounds. Central nervous system: exam limited by somnolence, pt follows commands Extremities: moves all, no edema, normal tone Skin: dry, intact, normal temperature Psychiatry: unable to evaluate due to somnolence   Data  Reviewed:  Notable labs --- BMP with K 3.4, glucose 100, ca 8.2, Hbg 9.8 from 11.1   Family Communication: None   Disposition: Status is: Inpatient Remains inpatient appropriate because: remains on IV Keppra, persistent encephalopathy   Planned Discharge Destination: Home    Time spent: 45 minutes  Author: Ezekiel Slocumb, DO 02/25/2022 2:17  PM  For on call review [email or web address].

## 2022-02-25 NOTE — Hospital Course (Addendum)
Felicia Nelson is a 42 y.o. female with medical history significant of seizure not taking medications, depression, polysubstance abuse, cocaine abuse, tobacco abuse, alcohol use, cervical cancer (s/p of LEEP), who presented via EMS on 02/24/2022 with seizures.   On admission, she reported that she drinks, had some alcohol the night before, then fell in the  morning, injured her head.  She complains of headache on the occipital are and top of the head.   Per report, pt had one episode of seizure around 8 AM, EMS was called but she refused transport.  She had another witnessed seizure while she was in the car with her family. EMS reports that she was somnolent and postictal.  No incontinence or tongue bite.  Loaded with IV Keppra in the ED.    CT head showed bilateral acute subdural hematomas and possible shear injury, soft tissues swelling of left supraorbital area.    Admitted to stepdown as inpatient.  Neurosurgery and Neurology are consulted.  Repeat head CT after 6 hours was obtained and stable.  1/29: pt very somnolent and not very arousable on rounds this AM.  1/30: pt somewhat improved.  Has UTI symptoms, on antibiotics with culture pending  1/31: clinically improved.  Stable for discharge.

## 2022-02-25 NOTE — Assessment & Plan Note (Signed)
Likely reactive. Monitor fever curve, CBC

## 2022-02-25 NOTE — Progress Notes (Signed)
Attending Progress Note  History: Felicia Nelson is a 42 y.o presenting via EMS on 02/24/22 with seizures s/p fall. Found to have bilateral SDH and tSAH in the frontal lobe  HD 1: Pt somnolent on AM rounds. Improved. Pt denies headache  Physical Exam: Vitals:   02/25/22 1100 02/25/22 1200  BP: (!) 147/96 (!) 147/101  Pulse: (!) 102 98  Resp: (!) 25 19  Temp:  98 F (36.7 C)  SpO2: 98% 99%    AA Ox3 CNI  Strength:MAEW and equally No pronator drift.  Data:  Other tests/results:  02/24/22 CT head IMPRESSION: 1. No significant interval change in size of bilateral subdural hematomas, right greater than left. Small parafalcine component now seen. No significant mass effect or midline shift. 2. Similar 1 cm hemorrhagic contusion/shear injury at the anterior left frontal lobe. Additional possible small hemorrhage at the inferior left cerebellum, also unchanged. 3. No other new acute intracranial abnormality. 4. Chronic right maxillary sinusitis.     Electronically Signed   By: Jeannine Boga M.D.   On: 02/24/2022 20:12  Assessment/Plan:  Felicia Nelson is a 42 y.o with a history of polysubstance abuse, cervical CA, and seizures presenting with headache after falling and hitting her head found to have acute SDH and tSAH.  - repeat CT yesterday was stable. - Plan to follow up outpatient in 2-4 weeks with repeat head CT - Please call with any questions or concerns.  Cooper Render PA-C Department of Neurosurgery

## 2022-02-25 NOTE — Assessment & Plan Note (Signed)
Due to subdural hematomas most likley. Continue IV Keppra 1000 mg BID Appreciate neurology recs PRN Ativan seizure Seizure precautions

## 2022-02-25 NOTE — Assessment & Plan Note (Addendum)
CT showed bilateral acute subdural hematomas, subcortical hemorrhage in the anterior left frontal lobe concerning for shear injury, possible anterior inferior left cerebellum hemorrhage. Dr. Johnney Killian of neurosurgery is consulted.  Repeat CT scan at 6 hours was stable --Loaded and maintained on  IV Keppra 1000 mg BID --Transitioned to PO Keppra 500 mg BID --Follow up with outpatient neurology

## 2022-02-25 NOTE — Assessment & Plan Note (Signed)
-  Did counseling about importance of quitting substance use -Check UDS -Nicotine patch -CIWA protocol

## 2022-02-25 NOTE — Assessment & Plan Note (Signed)
Monitor on CIWA PRN Ativan

## 2022-02-25 NOTE — Assessment & Plan Note (Signed)
Likely from seizure. Initial lactate 3.2, normalized to 0.9. No s/sx's of infection.

## 2022-02-25 NOTE — Assessment & Plan Note (Signed)
Nicotine patch

## 2022-02-25 NOTE — Assessment & Plan Note (Signed)
Appears not on medications. Monitor.

## 2022-02-25 NOTE — Assessment & Plan Note (Signed)
Likely due to intoxication, alcohol. Fall precautions. Will get PT eval if needed once she's awake and ready for OOB activity.

## 2022-02-26 DIAGNOSIS — R569 Unspecified convulsions: Secondary | ICD-10-CM | POA: Diagnosis not present

## 2022-02-26 DIAGNOSIS — S065XAA Traumatic subdural hemorrhage with loss of consciousness status unknown, initial encounter: Secondary | ICD-10-CM | POA: Diagnosis not present

## 2022-02-26 DIAGNOSIS — Z789 Other specified health status: Secondary | ICD-10-CM | POA: Diagnosis not present

## 2022-02-26 DIAGNOSIS — N390 Urinary tract infection, site not specified: Secondary | ICD-10-CM | POA: Diagnosis present

## 2022-02-26 LAB — BASIC METABOLIC PANEL
Anion gap: 10 (ref 5–15)
BUN: 6 mg/dL (ref 6–20)
CO2: 20 mmol/L — ABNORMAL LOW (ref 22–32)
Calcium: 8.3 mg/dL — ABNORMAL LOW (ref 8.9–10.3)
Chloride: 107 mmol/L (ref 98–111)
Creatinine, Ser: 0.54 mg/dL (ref 0.44–1.00)
GFR, Estimated: >60 mL/min (ref >60)
Glucose, Bld: 92 mg/dL (ref 70–99)
Potassium: 3.6 mmol/L (ref 3.5–5.1)
Sodium: 137 mmol/L (ref 135–145)

## 2022-02-26 LAB — CBC
HCT: 31.3 % — ABNORMAL LOW (ref 36.0–46.0)
Hemoglobin: 10.3 g/dL — ABNORMAL LOW (ref 12.0–15.0)
MCH: 29.3 pg (ref 26.0–34.0)
MCHC: 32.9 g/dL (ref 30.0–36.0)
MCV: 88.9 fL (ref 80.0–100.0)
Platelets: 237 10*3/uL (ref 150–400)
RBC: 3.52 MIL/uL — ABNORMAL LOW (ref 3.87–5.11)
RDW: 13.7 % (ref 11.5–15.5)
WBC: 7.7 10*3/uL (ref 4.0–10.5)
nRBC: 0 % (ref 0.0–0.2)

## 2022-02-26 LAB — MAGNESIUM: Magnesium: 2 mg/dL (ref 1.7–2.4)

## 2022-02-26 MED ORDER — LEVETIRACETAM 500 MG PO TABS
500.0000 mg | ORAL_TABLET | Freq: Two times a day (BID) | ORAL | Status: DC
  Administered 2022-02-26 – 2022-02-27 (×3): 500 mg via ORAL
  Filled 2022-02-26 (×4): qty 1

## 2022-02-26 MED ORDER — SODIUM CHLORIDE 0.9 % IV SOLN
1.0000 g | INTRAVENOUS | Status: DC
  Administered 2022-02-26: 1 g via INTRAVENOUS
  Filled 2022-02-26: qty 10
  Filled 2022-02-26: qty 1

## 2022-02-26 NOTE — Progress Notes (Signed)
Occupational Therapy Evaluation Patient Details Name: Felicia Nelson MRN: [id number] DOB: 1980/10/23 Today's Date: 02/26/2022   History of Present Illness 42 year old female with seizures status post fall.  Noted to have bilateral subdurals and traumatic subarachnoid hemorrhage in the frontal lobe.  EEG to evaluate for seizure.   Clinical Impression   Upon entering the room, pt supine in bed and speaking to friend on the phone. Pt performs bed mobility without assistance and dons B socks while seated EOB and still talking on phone. Pt reports she lives in a tent in her friend's backyard because she is homeless. Pt is very vague about if she is planning on returning to tent at discharge or not. She states she has some friends she can stay with and they can help her. She is unable to give any home information to therapist about friend's home other than to say it did not have any steps to enter. Pt ambulates without physical assistance during session with vitals all WFLs. Pt is tearful discussing her situation with therapist. She returns to bed at end of session. No skilled OT needs at this time. OT to complete order.      Recommendations for follow up therapy are one component of a multi-disciplinary discharge planning process, led by the attending physician.  Recommendations may be updated based on patient status, additional functional criteria and insurance authorization.   Follow Up Recommendations  No OT follow up     Assistance Recommended at Discharge PRN  Patient can return home with the following Assistance with cooking/housework;Assist for transportation       Equipment Recommendations  None recommended by OT       Precautions / Restrictions Precautions Precautions: Fall      Mobility Bed Mobility Overal bed mobility: Independent                  Transfers Overall transfer level: Independent Equipment used: None                          ADL either  performed or assessed with clinical judgement   ADL Overall ADL's : Modified independent                                       General ADL Comments: no physical assistance as pt sit on EOB while talking on phone and donning B socks. Toilet transfer with supervision overall without use of AD.     Vision Patient Visual Report: No change from baseline              Pertinent Vitals/Pain Pain Assessment Pain Assessment: No/denies pain        Extremity/Trunk Assessment Upper Extremity Assessment Upper Extremity Assessment: Overall WFL for tasks assessed   Lower Extremity Assessment Lower Extremity Assessment: Overall WFL for tasks assessed       Communication Communication Communication: No difficulties   Cognition Arousal/Alertness: Awake/alert Behavior During Therapy: WFL for tasks assessed/performed Overall Cognitive Status: Within Functional Limits for tasks assessed                                 General Comments: Pt is cooperative and needs redirecting during session. She cries while discussing her home and family situation and states feeling lonely.  Home Living Family/patient expects to be discharged to:: Shelter/Homeless                                 Additional Comments: Pt reports living in a tent in the backyard of a friend's home.      Prior Functioning/Environment Prior Level of Function : Independent/Modified Independent                                 OT Goals(Current goals can be found in the care plan section) Acute Rehab OT Goals Patient Stated Goal: to get better OT Goal Formulation: With patient Time For Goal Achievement: 03/12/22 Potential to Achieve Goals: Good  OT Frequency:         AM-PAC OT "6 Clicks" Daily Activity     Outcome Measure Help from another person eating meals?: None Help from another person taking care of personal grooming?: None Help from  another person toileting, which includes using toliet, bedpan, or urinal?: None Help from another person bathing (including washing, rinsing, drying)?: None Help from another person to put on and taking off regular upper body clothing?: None Help from another person to put on and taking off regular lower body clothing?: None 6 Click Score: 24   End of Session Nurse Communication: Mobility status  Activity Tolerance: Patient tolerated treatment well Patient left: in bed;with call bell/phone within reach                   Time: 8366-2947 OT Time Calculation (min): 17 min Charges:  OT General Charges $OT Visit: 1 Visit OT Evaluation $OT Eval Low Complexity: 1 Low OT Treatments $Self Care/Home Management : 8-22 mins  Darleen Crocker, MS, OTR/L , CBIS ascom [phone]  02/26/22, 12:03 PM

## 2022-02-26 NOTE — Procedures (Signed)
Patient Name: Felicia Nelson  MRN: [id number]  Epilepsy Attending: [name]  Referring Physician/Provider: Rosalin Hawking, MD  Date:  02/26/2022 Duration: 23.20 mins  Patient history: 42 year old female with seizures status post fall.  Noted to have bilateral subdurals and traumatic subarachnoid hemorrhage in the frontal lobe.  EEG to evaluate for seizure.  Level of alertness: Awake  AEDs during EEG study: LEV, Ativan  Technical aspects: This EEG study was done with scalp electrodes positioned according to the 10-20 International system of electrode placement. Electrical activity was reviewed with band pass filter of 1-'70Hz'$ , sensitivity of 7 uV/mm, display speed of 54m/sec with a '60Hz'$  notched filter applied as appropriate. EEG data were recorded continuously and digitally stored.  Video monitoring was available and reviewed as appropriate.  Description: The posterior dominant rhythm consists of 8-9 Hz activity of moderate voltage (25-35 uV) seen predominantly in posterior head regions, symmetric and reactive to eye opening and eye closing.  EEG showed intermittent generalized 3-6 theta-delta slowing. There is also an excessive amount of 15 to 18 Hz beta activity distributed symmetrically and diffusely. Hyperventilation and photic stimulation were not performed.     ABNORMALITY - Intermittent slow, generalized - Excessive beta, generalized  IMPRESSION: This study is suggestive of mild diffuse encephalopathy, nonspecific to etiology.  The excessive beta activity seen in the background is most likely due to the effect of benzodiazepine and is a benign EEG pattern. No seizures or epileptiform discharges were seen throughout the recording.  Please note that lack of epileptiform activity during interictal EEG does not exclude the diagnosis of epilepsy.  Anselm Aumiller OBarbra Sarks

## 2022-02-26 NOTE — Progress Notes (Signed)
Eeg done 

## 2022-02-26 NOTE — Evaluation (Signed)
Physical Therapy Evaluation Patient Details Name: Felicia Nelson MRN: [id number] DOB: 15-Jul-1980 Today's Date: 02/26/2022  History of Present Illness  42 year old female with seizures status post fall.  Noted to have bilateral subdurals and traumatic subarachnoid hemorrhage in the frontal lobe.  EEG did not show seizure activity.  Clinical Impression  Pt was able to do all bed mobility and transfers w/o issue.  She had minimal unsteadiness on initial standing ("That's from being in bed too long") but no LOBs and she only showed improved stability and confidence with increased ambulation distance.  She ultimately went 200 ft w/o AD and reports feeling "ot quite back to normal" but confident that she can get back to her normal activity level once she can get up and walking more consistently.  Pt does not require further PT, will benefit from continued activity here and upon discharge.  Will complete PT orders.     Recommendations for follow up therapy are one component of a multi-disciplinary discharge planning process, led by the attending physician.  Recommendations may be updated based on patient status, additional functional criteria and insurance authorization.  Follow Up Recommendations No PT follow up      Assistance Recommended at Discharge PRN  Patient can return home with the following  Assist for transportation    Equipment Recommendations None recommended by PT  Recommendations for Other Services       Functional Status Assessment Patient has had a recent decline in their functional status and demonstrates the ability to make significant improvements in function in a reasonable and predictable amount of time.     Precautions / Restrictions Precautions Precautions: Fall Restrictions Weight Bearing Restrictions: No      Mobility  Bed Mobility Overal bed mobility: Independent                  Transfers Overall transfer level: Independent Equipment used: None                General transfer comment: initial small lean back on to bed but self arrests and maintains balance w/o UEs/AD    Ambulation/Gait Ambulation/Gait assistance: Supervision Gait Distance (Feet): 200 Feet Assistive device: None         General Gait Details: Pt able to maintain consistent cadence, speed during loop around the unit.  She did have occasional low grade stagger stepping that she easily self arrested t/o UEs "It's just from being in bed too long." Pt with increased confidence and speed with increased distance - Vitals stable t/o.  Stairs            Wheelchair Mobility    Modified Rankin (Stroke Patients Only)       Balance                                             Pertinent Vitals/Pain Pain Assessment Pain Assessment: Faces Faces Pain Scale: Hurts a little bit Pain Location: L ankle/foot pain    Home Living Family/patient expects to be discharged to:: Unsure (likely to friends home)                   Additional Comments: Pt reports living in a tent in the backyard of a mom's home.    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 ADLs Comments: independent     Hand Dominance        Extremity/Trunk Assessment   Upper Extremity Assessment Upper Extremity Assessment: Overall WFL for tasks assessed    Lower Extremity Assessment Lower Extremity Assessment: Overall WFL for tasks assessed       Communication   Communication: No difficulties  Cognition Arousal/Alertness: Awake/alert Behavior During Therapy: WFL for tasks assessed/performed Overall Cognitive Status: Within Functional Limits for tasks assessed                                          General Comments      Exercises     Assessment/Plan    PT Assessment Patient does  not need any further PT services  PT Problem List         PT Treatment Interventions      PT Goals (Current goals can be found in the Care Plan section)  Acute Rehab PT Goals Patient Stated Goal: leave the hospital today PT Goal Formulation: All assessment and education complete, DC therapy    Frequency       Co-evaluation               AM-PAC PT "6 Clicks" Mobility  Outcome Measure Help needed turning from your back to your side while in a flat bed without using bedrails?: None Help needed moving from lying on your back to sitting on the side of a flat bed without using bedrails?: None Help needed moving to and from a bed to a chair (including a wheelchair)?: None Help needed standing up from a chair using your arms (e.g., wheelchair or bedside chair)?: None Help needed to walk in hospital room?: None Help needed climbing 3-5 steps with a railing? : None 6 Click Score: 24    End of Session   Activity Tolerance: Patient tolerated treatment well Patient left: in bed;with call bell/phone within reach   PT Visit Diagnosis: Unsteadiness on feet (R26.81)    Time: 9833-8250 PT Time Calculation (min) (ACUTE ONLY): 18 min   Charges:   PT Evaluation $PT Eval Low Complexity: 1 Low          Kreg Shropshire, DPT 02/26/2022, 2:12 PM

## 2022-02-26 NOTE — Assessment & Plan Note (Signed)
Start empiric Rocephin Send urine sample for culture

## 2022-02-26 NOTE — Consult Note (Signed)
Neurology Consultation Note  Consult Requested by: Dr. Arbutus Ped  Reason for Consult: [place]  Consult Date: 02/26/22   The history was obtained from the pt and chart.  During history and examination, all items were able to obtain unless otherwise noted.  History of Present Illness:  Felicia Nelson is a 42 y.o. Caucasian female with PMH of seizure not on meds, substance abuse, alcohol abuse, smoker and cocaine abuse admitted for seizure, head trauma and SDH/brain contusion. Per pt, she was in her friend's house, walking in kitchen, apparently she had seizure and fall hit head but she could not remember. EMS called and she was doing well as that time, and she did not need to go to ER. Later, she and her family was in the truck when she had second witnessed GTC. She was sent to [facility] and CT showed b/l frontal SDH, left frontal and left cerebellar contusion. Keppra started after loading. She was admitted to ICU and repeat CT stable. No intervention needed.  NSG signed off. UDS showed cocaine. In 10/2021 her UDS showed cocaine and amphetamine. However, pt stated that she does not think she used cocaine lately. On my exam, pt doing well, no focal deficit, will switch keppra IV to po.    Past Medical History:  Diagnosis Date   Alcohol use    Cervical cancer (HCC)    Depression    Polysubstance abuse ([place])    Seizure ([place])    Tobacco abuse     Past Surgical History:  Procedure Laterality Date   cervical cancer, LEEP     CESAREAN SECTION      Family History  Problem Relation Age of Onset   Diabetes Mother     Social History:  reports that she has been smoking cigarettes and cigars. She has a 18.75 pack-year smoking history. She has never used smokeless tobacco. She reports current alcohol use of about 6.0 standard drinks of alcohol per week. She reports current drug use. Drug: "Crack" cocaine.  Allergies:  Allergies  Allergen Reactions   Penicillins     Other Reaction(s): Other (See  Comments), Unknown  Was told as a child she was allergic to penicillin    No current facility-administered medications on file prior to encounter.   Current Outpatient Medications on File Prior to Encounter  Medication Sig Dispense Refill   escitalopram (LEXAPRO) 10 MG tablet Take 10 mg by mouth daily. (Patient not taking: Reported on 02/24/2022)     hydrOXYzine (ATARAX) 25 MG tablet Take 25 mg by mouth at bedtime as needed (insomnia). (Patient not taking: Reported on 02/24/2022)     traMADol (ULTRAM) 50 MG tablet Take 1 tablet (50 mg total) by mouth every 6 (six) hours as needed. (Patient not taking: Reported on 11/20/2020) 12 tablet 0    Review of Systems: A full ROS was attempted today and was able to be performed.  Systems assessed include - Constitutional, Eyes, HENT, Respiratory, Cardiovascular, Gastrointestinal, Genitourinary, Integument/breast, Hematologic/lymphatic, Musculoskeletal, Neurological, Behavioral/Psych, Endocrine, Allergic/Immunologic - with pertinent responses as per HPI.  Physical Examination: Temp:  [97.8 F (36.6 C)-98.2 F (36.8 C)] 98.2 F (36.8 C) (01/30 1600) Pulse Rate:  [83-112] 102 (01/30 1700) Resp:  [10-33] 10 (01/30 1700) BP: (130-162)/(84-106) 152/103 (01/30 1700) SpO2:  [92 %-100 %] 100 % (01/30 1700)  General - cachetic, well developed, in no apparent distress.    Ophthalmologic - fundi not visualized due to noncooperation.    Cardiovascular - regular rhythm and rate  Mental Status -  Level of arousal and orientation to time, place, and person were intact. Mild lethargy Language including expression, naming, repetition, comprehension was assessed and found intact. Fund of Knowledge was assessed and was intact.  Cranial Nerves II - XII - II - Vision intact OU. III, IV, VI - Extraocular movements intact. V - Facial sensation intact bilaterally. VII - Facial movement intact bilaterally. VIII - Hearing & vestibular intact bilaterally. X -  Palate elevates symmetrically. XI - Chin turning & shoulder shrug intact bilaterally. XII - Tongue protrusion intact.  Motor Strength - The patient's strength was normal in all extremities and pronator drift was absent.   Motor Tone & Bulk - Muscle tone was assessed at the neck and appendages and was normal.  Bulk was normal and fasciculations were absent.   Reflexes - The patient's reflexes were normal in all extremities and she had no pathological reflexes.  Sensory - Light touch, temperature/pinprick were assessed and were normal.    Coordination - The patient had normal movements in the hands and feet with no ataxia or dysmetria.  Tremor was absent.  Gait and Station - deferred  Data Reviewed: EEG adult  Result Date: 02/26/2022 [name], MD     02/26/2022 11:48 AM Patient Name: Felicia Nelson MRN: [id number] Epilepsy Attending: [name] Referring Physician/Provider: Rosalin Hawking, MD Date:  02/26/2022 Duration: 23.20 mins Patient history: 42 year old female with seizures status post fall.  Noted to have bilateral subdurals and traumatic subarachnoid hemorrhage in the frontal lobe.  EEG to evaluate for seizure. Level of alertness: Awake AEDs during EEG study: LEV, Ativan Technical aspects: This EEG study was done with scalp electrodes positioned according to the 10-20 International system of electrode placement. Electrical activity was reviewed with band pass filter of 1-'70Hz'$ , sensitivity of 7 uV/mm, display speed of 6m/sec with a '60Hz'$  notched filter applied as appropriate. EEG data were recorded continuously and digitally stored.  Video monitoring was available and reviewed as appropriate. Description: The posterior dominant rhythm consists of 8-9 Hz activity of moderate voltage (25-35 uV) seen predominantly in posterior head regions, symmetric and reactive to eye opening and eye closing.  EEG showed intermittent generalized 3-6 theta-delta slowing. There is also an excessive  amount of 15 to 18 Hz beta activity distributed symmetrically and diffusely. Hyperventilation and photic stimulation were not performed.   ABNORMALITY - Intermittent slow, generalized - Excessive beta, generalized IMPRESSION: This study is suggestive of mild diffuse encephalopathy, nonspecific to etiology.  The excessive beta activity seen in the background is most likely due to the effect of benzodiazepine and is a benign EEG pattern. No seizures or epileptiform discharges were seen throughout the recording. Please note that lack of epileptiform activity during interictal EEG does not exclude the diagnosis of epilepsy. PLora Havens  CT Head Wo Contrast  Result Date: 02/24/2022 CLINICAL DATA:  Follow-up examination for subdural hemorrhage. EXAM: CT HEAD WITHOUT CONTRAST TECHNIQUE: Contiguous axial images were obtained from the base of the skull through the vertex without intravenous contrast. RADIATION DOSE REDUCTION: This exam was performed according to the departmental dose-optimization program which includes automated exposure control, adjustment of the mA and/or kV according to patient size and/or use of iterative reconstruction technique. COMPARISON:  Prior CT from earlier the same day. FINDINGS: Brain: Previously identified bilateral subdural hematomas again seen, little interval changed measuring up to 8 mm on the right and 4 mm on the left. Small parafalcine component now seen. No significant mass effect or midline  shift. Small hemorrhagic contusion at the anterior left frontal lobe also similar measuring 1 cm. Minimal surrounding edema without mass effect. Possible additional small hemorrhage at the anterior/inferior left cerebellum, unchanged. No other new intracranial hemorrhage. No acute large vessel territory infarct. No mass lesion. No hydrocephalus. Basilar cisterns remain patent. Vascular: No abnormal hyperdense vessel. Skull: Scalp soft tissues and calvarium demonstrate no new finding.  Sinuses/Orbits: Globes orbital soft tissues demonstrate no acute finding. Chronic right maxillary sinusitis noted. Mastoid air cells remain clear. Other: None. IMPRESSION: 1. No significant interval change in size of bilateral subdural hematomas, right greater than left. Small parafalcine component now seen. No significant mass effect or midline shift. 2. Similar 1 cm hemorrhagic contusion/shear injury at the anterior left frontal lobe. Additional possible small hemorrhage at the inferior left cerebellum, also unchanged. 3. No other new acute intracranial abnormality. 4. Chronic right maxillary sinusitis. Electronically Signed   By: Jeannine Boga M.D.   On: 02/24/2022 20:12   CT Head Wo Contrast  Result Date: 02/24/2022 CLINICAL DATA:  New onset seizure.  Possible trauma to head. EXAM: CT HEAD WITHOUT CONTRAST TECHNIQUE: Contiguous axial images were obtained from the base of the skull through the vertex without intravenous contrast. RADIATION DOSE REDUCTION: This exam was performed according to the departmental dose-optimization program which includes automated exposure control, adjustment of the mA and/or kV according to patient size and/or use of iterative reconstruction technique. COMPARISON:  CT head without contrast 07/15/2016 FINDINGS: Brain: Bilateral hyperdense extra-axial collections are present maximum thickness on the right is 7 mm anteriorly. The left-sided collection measures 3.5 mm maximally. Subcortical hemorrhage is noted in the anterior left frontal lobe on image 19 of series 3. No other definite parenchymal hemorrhage is present. Blood extends along the anterior falx. No acute or focal cortical abnormalities are present. Basal ganglia are intact. The ventricles are of normal size. Brainstem is within normal limits. Focal hyperdensity along the anterior inferior left cerebellum likely represents additional hemorrhage. Cerebellum is otherwise within normal limits. Vascular: No hyperdense  vessel or unexpected calcification. Skull: Focal left supraorbital soft tissue swelling is present without underlying fracture. No other significant extracranial soft tissue injury is present. No acute fractures are present. Sinuses/Orbits: The right maxillary sinus is opacified. Scattered opacification of right ethmoid air cells is present. The paranasal sinuses and mastoid air cells are otherwise clear. The globes and orbits are within normal limits. IMPRESSION: 1. Bilateral hyperdense extra-axial collections compatible with acute subdural hematomas. 2. Subcortical hemorrhage in the anterior left frontal lobe concerning for shear injury. 3. Focal hyperdensity along the anterior inferior left cerebellum likely represents additional hemorrhage. 4. Focal left supraorbital soft tissue swelling without underlying fracture. Critical Value/emergent results were called by telephone at the time of interpretation on 02/24/2022 at 1:58 pm to provider [facility] , who verbally acknowledged these results. Electronically Signed   By: San Morelle M.D.   On: 02/24/2022 14:01    Assessment: 42 y.o. female with PMH of seizure not on meds, substance abuse, alcohol abuse, smoker and cocaine abuse admitted for seizure, head trauma and SDH/brain contusion. CT showed b/l frontal SDH, left frontal and left cerebellar contusion. Keppra started after loading. She was admitted to ICU and repeat CT stable. No intervention needed.  NSG signed off. UDS showed cocaine. EEG no seizure. No MRI needed given CT findings consistent with traumatic SDH/contusion. Pt doing well, no focal deficit, will switch keppra IV to po.   Plan: - OK to transfer out of ICU -  EEG no seizure - No MRI needed given CT findings consistent with traumatic SDH/contusion - switch keppra IV to po. Continue keppra on discharge.  - no driving until seizure free for 6 months and under physician's care.  - follow up with [facility] clinic neurology in 4 weeks  after discharge. - smoking, cocaine cessation education provided.  - limitation of alcohol education provided.  - discussed with Dr. Renda Rolls  Neurology will sign off. Please call with questions. Pt will follow up with [facility] neurology in about 4 weeks. Thanks for the consultation and allowing [place] to participate in the care of this patient.  Rosalin Hawking, MD PhD Stroke Neurology 02/26/2022 6:36 PM

## 2022-02-26 NOTE — Progress Notes (Signed)
Progress Note   Patient: Felicia Nelson ENI:[id number] DOB: [birth year] DOA: 02/24/2022     2 DOS: the patient was seen and examined on 02/26/2022   Brief hospital course: Felicia Nelson is a 42 y.o. female with medical history significant of seizure not taking medications, depression, polysubstance abuse, cocaine abuse, tobacco abuse, alcohol use, cervical cancer (s/p of LEEP), who presented via EMS on 02/24/2022 with seizures.   On admission, she reported that she drinks, had some alcohol the night before, then fell in the  morning, injured her head.  She complains of headache on the occipital are and top of the head.   Per report, pt had one episode of seizure around 8 AM, EMS was called but she refused transport.  She had another witnessed seizure while she was in the car with her family. EMS reports that she was somnolent and postictal.  No incontinence or tongue bite.  Loaded with IV Keppra in the ED.    CT head showed bilateral acute subdural hematomas and possible shear injury, soft tissues swelling of left supraorbital area.    Admitted to stepdown as inpatient.  Neurosurgery and Neurology are consulted.  Repeat head CT after 6 hours was obtained and stable.  1/29: pt very somnolent and not very arousable on rounds this AM.  Assessment and Plan: * SDH (subdural hematoma) (HCC)  CT showed bilateral acute subdural hematomas, subcortical hemorrhage in the anterior left frontal lobe concerning for shear injury, possible anterior inferior left cerebellum hemorrhage. Dr. Johnney Killian of neurosurgery is consulted.  Repeat CT scan at 6 hours was stable --Continue IV Keppra 1000 mg BID --Follow up neurology and neurosurgery recommendations --Frequent neurocheck  UTI (urinary tract infection) Start empiric Rocephin Send urine sample for culture   Seizure ([place]) Due to subdural hematomas most likley. Appreciate neurology recs Neurology changed IV Keppra 1000 mg BID to PO Keppra 500 mg BID PRN  Ativan seizure Seizure precautions PT/OT evaluations Follow up pending EEG  Depression Appears not on medications. Monitor.  Fall at home, initial encounter Likely due to intoxication, alcohol. Fall precautions. Will get PT eval if needed once she's awake and ready for OOB activity.  Elevated lactic acid level Likely from seizure. Initial lactate 3.2, normalized to 0.9. No s/sx's of infection.  Polysubstance abuse ([place]) -Did counseling about importance of quitting substance use -Check UDS -Nicotine patch -CIWA protocol  Alcohol use Monitor on CIWA PRN Ativan   Tobacco abuse Nicotine patch  Leukocytosis Likely reactive. Monitor fever curve, CBC        Subjective: Pt seen in ICU this AM, awake and reports feeling better.  RN and patient report very malodorous urine and pt thinks urinary frequency recently.  No fever/chills.  She asks about when she'll be discharged.     Physical Exam: Vitals:   02/26/22 1000 02/26/22 1100 02/26/22 1200 02/26/22 1300  BP: (!) 162/92 (!) 146/97 (!) 152/98 (!) 135/94  Pulse: 86 83 89 92  Resp: '17 17 20 '$ (!) 24  Temp:   97.8 F (36.6 C)   TempSrc:   Oral   SpO2: 92% 98% 96% 98%  Weight:      Height:       General exam: awake, alert, no acute distress HEENT: moist mucus membranes, hearing grossly normal  Respiratory system: CTAB, no wheezes, rales or rhonchi, normal respiratory effort. Cardiovascular system: normal S1/S2, RRR, no JVD, murmurs, rubs, gallops,  no pedal edema.   Gastrointestinal system: soft, NT, ND, no HSM felt, +  bowel sounds. Central nervous system: A&O x 3, normal speech, grossly non-focal exam Extremities: moves all, no edema, normal tone Skin: dry, intact, normal temperature Psychiatry: normal mood, congruent affect   Data Reviewed:  Notable labs --- BMP with bicarb 20.  Hbg 10.3   Family Communication: None   Disposition: Status is: Inpatient Remains inpatient appropriate because: remains on IV  Keppra, persistent encephalopathy / delirium. EEG, PT/OT today.  Urine culture sent and started IV antibiotics, await culture.  Expect d/c tomorrow most likely.   Planned Discharge Destination: Home    Time spent: 45 minutes  Author: Ezekiel Slocumb, DO 02/26/2022 2:35 PM  For on call review [email or web address].

## 2022-02-27 MED ORDER — LEVETIRACETAM 500 MG PO TABS: 500.0000 mg | ORAL_TABLET | Freq: Two times a day (BID) | ORAL | 3 refills | Status: AC

## 2022-02-27 MED ORDER — ADULT MULTIVITAMIN W/MINERALS CH: 1.0000 | ORAL_TABLET | Freq: Every day | ORAL | Status: AC

## 2022-02-27 MED ORDER — CEFDINIR 300 MG PO CAPS
300.0000 mg | ORAL_CAPSULE | Freq: Two times a day (BID) | ORAL | Status: DC
  Administered 2022-02-27: 300 mg via ORAL
  Filled 2022-02-27 (×2): qty 1

## 2022-02-27 MED ORDER — CEFDINIR 300 MG PO CAPS: 300.0000 mg | ORAL_CAPSULE | Freq: Two times a day (BID) | ORAL | 0 refills | Status: AC

## 2022-02-27 MED ORDER — VITAMIN B-1 100 MG PO TABS: 100.0000 mg | ORAL_TABLET | Freq: Every day | ORAL | Status: AC

## 2022-02-27 MED ORDER — SALINE SPRAY 0.65 % NA SOLN
1.0000 | NASAL | Status: DC | PRN
  Filled 2022-02-27: qty 44

## 2022-02-27 MED ORDER — FOLIC ACID 1 MG PO TABS: 1.0000 mg | ORAL_TABLET | Freq: Every day | ORAL | Status: AC

## 2022-02-27 NOTE — Discharge Summary (Addendum)
Physician Discharge Summary   Patient: Felicia Nelson MRN: [id number] DOB: 06/03/1980  Admit date:     02/24/2022  Discharge date: 02/27/2022  Discharge Physician: Pennie Banter   PCP: Pcp, No   Recommendations at discharge:    Follow up with Primary Care in 1-2 weeks Follow up with Neurology Pt instructed NO DRIVING x 6 months and until cleared by outpatient neurologist Repeat CBC, BMP, Mg in 1-2 weeks Continue efforts to encourage cessation of drug use  Discharge Diagnoses: Principal Problem:   SDH (subdural hematoma) (HCC) Active Problems:   UTI (urinary tract infection)   Seizure (HCC)   Depression   Fall at home, initial encounter   Tobacco abuse   Alcohol use   Polysubstance abuse (HCC)   Resolved Problems:   Elevated lactic acid level   Leukocytosis     Acute Encephalopathy due to Post-ictal state - resolved  Hospital Course: Felicia Nelson is a 42 y.o. female with medical history significant of seizure not taking medications, depression, polysubstance abuse, cocaine abuse, tobacco abuse, alcohol use, cervical cancer (s/p of LEEP), who presented via EMS on 02/24/2022 with seizures.   On admission, she reported that she drinks, had some alcohol the night before, then fell in the  morning, injured her head.  She complains of headache on the occipital are and top of the head.   Per report, pt had one episode of seizure around 8 AM, EMS was called but she refused transport.  She had another witnessed seizure while she was in the car with her family. EMS reports that she was somnolent and postictal.  No incontinence or tongue bite.  Loaded with IV Keppra in the ED.    CT head showed bilateral acute subdural hematomas and possible shear injury, soft tissues swelling of left supraorbital area.    Admitted to stepdown as inpatient.  Neurosurgery and Neurology are consulted.  Repeat head CT after 6 hours was obtained and stable.  1/29: pt very somnolent and not very  arousable on rounds this AM.  1/30: pt somewhat improved.  Has UTI symptoms, on antibiotics with culture pending  1/31: clinically improved.  Stable for discharge.  Assessment and Plan: * SDH (subdural hematoma) (HCC)  CT showed bilateral acute subdural hematomas, subcortical hemorrhage in the anterior left frontal lobe concerning for shear injury, possible anterior inferior left cerebellum hemorrhage. Dr. Madaline Brilliant of neurosurgery is consulted.  Repeat CT scan at 6 hours was stable --Loaded and maintained on  IV Keppra 1000 mg BID --Transitioned to PO Keppra 500 mg BID --Follow up with outpatient neurology   UTI (urinary tract infection) Start empiric Rocephin Send urine sample for culture   Seizure [facility]) Due to subdural hematomas most likley. Appreciate neurology recs Neurology changed IV Keppra 1000 mg BID to PO Keppra 500 mg BID PRN Ativan seizure Seizure precautions PT/OT evaluations Follow up pending EEG  Depression Appears not on medications. Monitor.  Fall at home, initial encounter Likely due to intoxication, alcohol. Fall precautions. Will get PT eval if needed once she's awake and ready for OOB activity.  Elevated lactic acid level-resolved as of 03/06/2022 Likely from seizure. Initial lactate 3.2, normalized to 0.9. No s/sx's of infection.  Polysubstance abuse (HCC) -Did counseling about importance of quitting substance use -Check UDS -Nicotine patch -CIWA protocol  Alcohol use Monitor on CIWA PRN Ativan   Tobacco abuse Nicotine patch  Leukocytosis-resolved as of 03/06/2022 Likely reactive. Monitor fever curve, CBC  Consultants: Neurosurgery, Neurology  Procedures performed: None   Disposition: Home Diet recommendation:  Regular diet DISCHARGE MEDICATION: Allergies as of 02/27/2022       Reactions   Penicillins    Other Reaction(s): Other (See Comments), Unknown Was told as a child she was allergic to penicillin         Medication List     STOP taking these medications    escitalopram 10 MG tablet Commonly known as: LEXAPRO   hydrOXYzine 25 MG tablet Commonly known as: ATARAX   traMADol 50 MG tablet Commonly known as: Ultram       TAKE these medications    folic acid 1 MG tablet Commonly known as: FOLVITE Take 1 tablet (1 mg total) by mouth daily.   levETIRAcetam 500 MG tablet Commonly known as: KEPPRA Take 1 tablet (500 mg total) by mouth 2 (two) times daily.   multivitamin with minerals Tabs tablet Take 1 tablet by mouth daily.   thiamine 100 MG tablet Commonly known as: Vitamin B-1 Take 1 tablet (100 mg total) by mouth daily.       ASK your doctor about these medications    cefdinir 300 MG capsule Commonly known as: OMNICEF Take 1 capsule (300 mg total) by mouth every 12 (twelve) hours for 4 days. Ask about: Should I take this medication?        Follow-up Information     Susanne Borders, PA Follow up on 03/19/2022.   Specialty: Neurosurgery Why: 03/19/22 9:30 AM Contact information: [address] [place] [address] [phone]         Lonell Face, MD. Schedule an appointment as soon as possible for a visit in 1 month(s).   Specialty: Neurology Why: Patient to schedule appointment. Contact information: [address] [facility] [place] [address] [phone]         [facility] Call.   Why: Left voicemail for office to call patient for follow up. Contact information: [address] [place] [address] [phone]               Discharge Exam: Filed Weights   02/24/22 1232  Weight: 54.4 kg   General exam: awake, alert, no acute distress, chronically ill appearsing HEENT: atraumatic, clear conjunctiva, anicteric sclera, moist mucus membranes, hearing grossly normal  Respiratory system: CTAB, no wheezes, rales or rhonchi, normal respiratory  effort. Cardiovascular system: normal S1/S2, RRR, no JVD, murmurs, rubs, gallops,  no pedal edema.   Gastrointestinal system: soft, NT, ND, no HSM felt, +bowel sounds. Central nervous system: A&O x4. no gross focal neurologic deficits, normal speech Extremities: moves all , no edema, normal tone Skin: dry, intact, normal temperature, normal color, No rashes, lesions or ulcers Psychiatry: normal mood, congruent affect, judgement and insight appear normal   Condition at discharge: stable  The results of significant diagnostics from this hospitalization (including imaging, microbiology, ancillary and laboratory) are listed below for reference.   Imaging Studies: EEG adult  Result Date: March 08, 2022 Charlsie Quest, MD     March 08, 2022 11:48 AM Patient Name: Felicia Nelson MRN: [id number] Epilepsy Attending: Charlsie Quest Referring Physician/Provider: Marvel Plan, MD Date:  03/08/2022 Duration: 23.20 mins Patient history: 42 year old female with seizures status post fall.  Noted to have bilateral subdurals and traumatic subarachnoid hemorrhage in the frontal lobe.  EEG to evaluate for seizure. Level of alertness: Awake AEDs during EEG study: LEV, Ativan Technical aspects: This EEG  study was done with scalp electrodes positioned according to the 10-20 International system of electrode placement. Electrical activity was reviewed with band pass filter of 1-70Hz , sensitivity of 7 uV/mm, display speed of 71mm/sec with a 60Hz  notched filter applied as appropriate. EEG data were recorded continuously and digitally stored.  Video monitoring was available and reviewed as appropriate. Description: The posterior dominant rhythm consists of 8-9 Hz activity of moderate voltage (25-35 uV) seen predominantly in posterior head regions, symmetric and reactive to eye opening and eye closing.  EEG showed intermittent generalized 3-6 theta-delta slowing. There is also an excessive amount of 15 to 18 Hz beta activity  distributed symmetrically and diffusely. Hyperventilation and photic stimulation were not performed.   ABNORMALITY - Intermittent slow, generalized - Excessive beta, generalized IMPRESSION: This study is suggestive of mild diffuse encephalopathy, nonspecific to etiology.  The excessive beta activity seen in the background is most likely due to the effect of benzodiazepine and is a benign EEG pattern. No seizures or epileptiform discharges were seen throughout the recording. Please note that lack of epileptiform activity during interictal EEG does not exclude the diagnosis of epilepsy. Charlsie Quest   CT Head Wo Contrast  Result Date: 02/24/2022 CLINICAL DATA:  Follow-up examination for subdural hemorrhage. EXAM: CT HEAD WITHOUT CONTRAST TECHNIQUE: Contiguous axial images were obtained from the base of the skull through the vertex without intravenous contrast. RADIATION DOSE REDUCTION: This exam was performed according to the departmental dose-optimization program which includes automated exposure control, adjustment of the mA and/or kV according to patient size and/or use of iterative reconstruction technique. COMPARISON:  Prior CT from earlier the same day. FINDINGS: Brain: Previously identified bilateral subdural hematomas again seen, little interval changed measuring up to 8 mm on the right and 4 mm on the left. Small parafalcine component now seen. No significant mass effect or midline shift. Small hemorrhagic contusion at the anterior left frontal lobe also similar measuring 1 cm. Minimal surrounding edema without mass effect. Possible additional small hemorrhage at the anterior/inferior left cerebellum, unchanged. No other new intracranial hemorrhage. No acute large vessel territory infarct. No mass lesion. No hydrocephalus. Basilar cisterns remain patent. Vascular: No abnormal hyperdense vessel. Skull: Scalp soft tissues and calvarium demonstrate no new finding. Sinuses/Orbits: Globes orbital soft  tissues demonstrate no acute finding. Chronic right maxillary sinusitis noted. Mastoid air cells remain clear. Other: None. IMPRESSION: 1. No significant interval change in size of bilateral subdural hematomas, right greater than left. Small parafalcine component now seen. No significant mass effect or midline shift. 2. Similar 1 cm hemorrhagic contusion/shear injury at the anterior left frontal lobe. Additional possible small hemorrhage at the inferior left cerebellum, also unchanged. 3. No other new acute intracranial abnormality. 4. Chronic right maxillary sinusitis. Electronically Signed   By: Rise Mu M.D.   On: 02/24/2022 20:12   CT Head Wo Contrast  Result Date: 02/24/2022 CLINICAL DATA:  New onset seizure.  Possible trauma to head. EXAM: CT HEAD WITHOUT CONTRAST TECHNIQUE: Contiguous axial images were obtained from the base of the skull through the vertex without intravenous contrast. RADIATION DOSE REDUCTION: This exam was performed according to the departmental dose-optimization program which includes automated exposure control, adjustment of the mA and/or kV according to patient size and/or use of iterative reconstruction technique. COMPARISON:  CT head without contrast 07/15/2016 FINDINGS: Brain: Bilateral hyperdense extra-axial collections are present maximum thickness on the right is 7 mm anteriorly. The left-sided collection measures 3.5 mm maximally. Subcortical hemorrhage is noted in  the anterior left frontal lobe on image 19 of series 3. No other definite parenchymal hemorrhage is present. Blood extends along the anterior falx. No acute or focal cortical abnormalities are present. Basal ganglia are intact. The ventricles are of normal size. Brainstem is within normal limits. Focal hyperdensity along the anterior inferior left cerebellum likely represents additional hemorrhage. Cerebellum is otherwise within normal limits. Vascular: No hyperdense vessel or unexpected calcification.  Skull: Focal left supraorbital soft tissue swelling is present without underlying fracture. No other significant extracranial soft tissue injury is present. No acute fractures are present. Sinuses/Orbits: The right maxillary sinus is opacified. Scattered opacification of right ethmoid air cells is present. The paranasal sinuses and mastoid air cells are otherwise clear. The globes and orbits are within normal limits. IMPRESSION: 1. Bilateral hyperdense extra-axial collections compatible with acute subdural hematomas. 2. Subcortical hemorrhage in the anterior left frontal lobe concerning for shear injury. 3. Focal hyperdensity along the anterior inferior left cerebellum likely represents additional hemorrhage. 4. Focal left supraorbital soft tissue swelling without underlying fracture. Critical Value/emergent results were called by telephone at the time of interpretation on 02/24/2022 at 1:58 pm to provider [facility] , who verbally acknowledged these results. Electronically Signed   By: Marin Roberts M.D.   On: 02/24/2022 14:01    Microbiology: Results for orders placed or performed during the hospital encounter of 02/24/22  MRSA Next Gen by PCR, Nasal     Status: None   Collection Time: 02/24/22  4:29 PM   Specimen: Nasal Mucosa; Nasal Swab  Result Value Ref Range Status   MRSA by PCR Next Gen NOT DETECTED NOT DETECTED Final    Comment: (NOTE) The GeneXpert MRSA Assay (FDA approved for NASAL specimens only), is one component of a comprehensive MRSA colonization surveillance program. It is not intended to diagnose MRSA infection nor to guide or monitor treatment for MRSA infections. Test performance is not FDA approved in patients less than 16 years old. Performed at [facility], [address], [place], [place] [address]   Urine Culture     Status: Abnormal   Collection Time: 02/25/22  5:43 AM   Specimen: Urine, Clean Catch  Result Value Ref Range Status   Specimen Description    Final    URINE, CLEAN CATCH Performed at [facility], [address], [place], [place] [address]    Special Requests   Final    NONE Performed at [facility], [address] [place], [place] [address]    Culture (A)  Final    >=100,000 COLONIES/mL ESCHERICHIA COLI Confirmed Extended Spectrum Beta-Lactamase Producer (ESBL).  In bloodstream infections from ESBL organisms, carbapenems are preferred over piperacillin/tazobactam. They are shown to have a lower risk of mortality.    Report Status 02/28/2022 FINAL  Final   Organism ID, Bacteria ESCHERICHIA COLI (A)  Final      Susceptibility   Escherichia coli - MIC*    AMPICILLIN >=32 RESISTANT Resistant     CEFAZOLIN >=64 RESISTANT Resistant     CEFEPIME 16 RESISTANT Resistant     CEFTRIAXONE >=64 RESISTANT Resistant     CIPROFLOXACIN >=4 RESISTANT Resistant     GENTAMICIN <=1 SENSITIVE Sensitive     IMIPENEM <=0.25 SENSITIVE Sensitive     NITROFURANTOIN <=16 SENSITIVE Sensitive     TRIMETH/SULFA <=20 SENSITIVE Sensitive     AMPICILLIN/SULBACTAM >=32 RESISTANT Resistant     PIP/TAZO <=4 SENSITIVE Sensitive     * >=100,000 COLONIES/mL ESCHERICHIA COLI  Labs: CBC: No results for input(s): "WBC", "NEUTROABS", "HGB", "HCT", "MCV", "PLT" in the last 168 hours.  Basic Metabolic Panel: No results for input(s): "NA", "K", "CL", "CO2", "GLUCOSE", "BUN", "CREATININE", "CALCIUM", "MG", "PHOS" in the last 168 hours.  Liver Function Tests: No results for input(s): "AST", "ALT", "ALKPHOS", "BILITOT", "PROT", "ALBUMIN" in the last 168 hours.  CBG: No results for input(s): "GLUCAP" in the last 168 hours.   Discharge time spent: less than 30 minutes.  Signed: Pennie Banter, DO [facility] 03/06/2022

## 2022-02-27 NOTE — TOC Transition Note (Addendum)
Transition of Care [facility]) - CM/SW Discharge Note   Patient Details  Name: Felicia Nelson MRN: [id number] Date of Birth: 30-Sep-1980  [facility] CM/SW Contact:  Beverly Sessions, RN Phone Number: 02/27/2022, 11:16 AM   Clinical Narrative:    No TOC needs identified for discharge  Update.  Patient states she no longer has transport for discharge. Patient states she is going to her boyfriends house.  Provided cab voucher to the address she provided.  Bedside RN to have patient sign ride waiver, place patient sticker, and place in chart    Barriers to Discharge: Continued Medical Work up   Patient Goals and CMS Choice      Discharge Placement                         Discharge Plan and Services Additional resources added to the After Visit Summary for     Discharge Planning Services: CM Consult            DME Arranged: N/A         HH Arranged: NA HH Agency: NA        Social Determinants of Health (SDOH) Interventions SDOH Screenings   Food Insecurity: No Food Insecurity (02/24/2022)  Housing: High Risk (02/24/2022)  Transportation Needs: No Transportation Needs (02/24/2022)  Utilities: Not At Risk (02/24/2022)  Tobacco Use: High Risk (02/24/2022)     Readmission Risk Interventions     No data to display

## 2022-02-27 NOTE — Progress Notes (Signed)
Report to floor room 209.  Ena Dawley RN receiving.  Taken by wheelchair and released in stable to condition

## 2022-02-27 NOTE — Progress Notes (Signed)
Mobility Specialist - Progress Note   02/27/22 1000  Mobility  Activity Ambulated independently in hallway  Level of Assistance Independent  Assistive Device None  Distance Ambulated (ft) 320 ft  Activity Response Tolerated well  $Mobility charge 1 Mobility     Pt lying in bed upon arrival, utilizing RA. Pt completed bed mobility, STS, and ambulation independently. No complaints other than a headache, no dizziness. Does mildly stagger during ambulation, but no LOB. Pt returned to bed with needs in reach.    Kathee Delton Mobility Specialist 02/27/22, 10:38 AM

## 2022-02-28 LAB — URINE CULTURE: Culture: >=100000 — AB

## 2022-03-06 ENCOUNTER — Encounter: Payer: Self-pay | Admitting: Internal Medicine

## 2022-03-19 ENCOUNTER — Ambulatory Visit: Payer: Medicaid Other | Admitting: Neurosurgery

## 2022-03-19 ENCOUNTER — Ambulatory Visit: Payer: Self-pay | Admitting: Neurosurgery

## 2022-11-07 ENCOUNTER — Ambulatory Visit: Payer: Medicaid Other
